# Patient Record
Sex: Female | Born: 1985 | Race: White | Hispanic: No | Smoking: Current some day smoker
Health system: Southern US, Community
[De-identification: ages and names within clinical notes are randomized; demographics above are authoritative.]

## PROBLEM LIST (undated history)

## (undated) DIAGNOSIS — I456 Pre-excitation syndrome: Secondary | ICD-10-CM

## (undated) DIAGNOSIS — A630 Anogenital (venereal) warts: Secondary | ICD-10-CM

## (undated) DIAGNOSIS — F909 Attention-deficit hyperactivity disorder, unspecified type: Secondary | ICD-10-CM

## (undated) DIAGNOSIS — R87629 Unspecified abnormal cytological findings in specimens from vagina: Secondary | ICD-10-CM

## (undated) HISTORY — PX: A FLUTTER ABLATION: SHX5348

## (undated) HISTORY — DX: Attention-deficit hyperactivity disorder, unspecified type: F90.9

## (undated) HISTORY — PX: WISDOM TOOTH EXTRACTION: SHX21

---

## 2016-01-02 ENCOUNTER — Emergency Department (HOSPITAL_COMMUNITY)
Admission: EM | Admit: 2016-01-02 | Discharge: 2016-01-03 | Disposition: A | Discharge status: Other Acute Inpatient Hospital | Payer: Self-pay | Attending: Emergency Medicine | Admitting: Emergency Medicine

## 2016-01-02 ENCOUNTER — Encounter (HOSPITAL_COMMUNITY): Payer: Self-pay | Admitting: Emergency Medicine

## 2016-01-02 DIAGNOSIS — Z87891 Personal history of nicotine dependence: Secondary | ICD-10-CM | POA: Insufficient documentation

## 2016-01-02 DIAGNOSIS — F1910 Other psychoactive substance abuse, uncomplicated: Secondary | ICD-10-CM | POA: Insufficient documentation

## 2016-01-02 DIAGNOSIS — F063 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, unspecified: Secondary | ICD-10-CM

## 2016-01-02 DIAGNOSIS — T50904A Poisoning by unspecified drugs, medicaments and biological substances, undetermined, initial encounter: Secondary | ICD-10-CM | POA: Insufficient documentation

## 2016-01-02 LAB — URINALYSIS, ROUTINE W REFLEX MICROSCOPIC
Bilirubin Urine: NEGATIVE
Glucose, UA: NEGATIVE mg/dL
HGB URINE DIPSTICK: NEGATIVE
KETONES UR: NEGATIVE mg/dL
Leukocytes, UA: NEGATIVE
Nitrite: NEGATIVE
PROTEIN: NEGATIVE mg/dL
Specific Gravity, Urine: 1.012 (ref 1.005–1.030)
pH: 8 (ref 5.0–8.0)

## 2016-01-02 LAB — COMPREHENSIVE METABOLIC PANEL
ALBUMIN: 5.2 g/dL — AB (ref 3.5–5.0)
ALT: 15 U/L (ref 14–54)
AST: 19 U/L (ref 15–41)
Alkaline Phosphatase: 46 U/L (ref 38–126)
Anion gap: 9 (ref 5–15)
BUN: 10 mg/dL (ref 6–20)
CHLORIDE: 104 mmol/L (ref 101–111)
CO2: 27 mmol/L (ref 22–32)
CREATININE: 0.9 mg/dL (ref 0.44–1.00)
Calcium: 9.7 mg/dL (ref 8.9–10.3)
GFR calc Af Amer: >60 mL/min (ref >60)
GLUCOSE: 90 mg/dL (ref 65–99)
POTASSIUM: 3.7 mmol/L (ref 3.5–5.1)
SODIUM: 140 mmol/L (ref 135–145)
Total Bilirubin: 1 mg/dL (ref 0.3–1.2)
Total Protein: 8.3 g/dL — ABNORMAL HIGH (ref 6.5–8.1)

## 2016-01-02 LAB — PREGNANCY, URINE: Preg Test, Ur: NEGATIVE

## 2016-01-02 LAB — CBC WITH DIFFERENTIAL/PLATELET
Basophils Absolute: 0 10*3/uL (ref 0.0–0.1)
Basophils Relative: 0 %
EOS PCT: 0 %
Eosinophils Absolute: 0 10*3/uL (ref 0.0–0.7)
HCT: 42.4 % (ref 36.0–46.0)
HEMOGLOBIN: 14.6 g/dL (ref 12.0–15.0)
LYMPHS ABS: 3.3 10*3/uL (ref 0.7–4.0)
LYMPHS PCT: 36 %
MCH: 31.3 pg (ref 26.0–34.0)
MCHC: 34.4 g/dL (ref 30.0–36.0)
MCV: 91 fL (ref 78.0–100.0)
MONOS PCT: 5 %
Monocytes Absolute: 0.5 10*3/uL (ref 0.1–1.0)
Neutro Abs: 5.3 10*3/uL (ref 1.7–7.7)
Neutrophils Relative %: 59 %
PLATELETS: 330 10*3/uL (ref 150–400)
RBC: 4.66 MIL/uL (ref 3.87–5.11)
RDW: 12.5 % (ref 11.5–15.5)
WBC: 9.1 10*3/uL (ref 4.0–10.5)

## 2016-01-02 LAB — ACETAMINOPHEN LEVEL

## 2016-01-02 LAB — SALICYLATE LEVEL

## 2016-01-02 LAB — RAPID URINE DRUG SCREEN, HOSP PERFORMED
AMPHETAMINES: POSITIVE — AB
Barbiturates: NOT DETECTED
Benzodiazepines: POSITIVE — AB
Cocaine: NOT DETECTED
Opiates: NOT DETECTED
TETRAHYDROCANNABINOL: NOT DETECTED

## 2016-01-02 LAB — ETHANOL: ALCOHOL ETHYL (B): 58 mg/dL — AB (ref <5)

## 2016-01-02 MED ORDER — SODIUM CHLORIDE 0.9 % IV SOLN: INTRAVENOUS | Status: DC

## 2016-01-02 MED ORDER — LORAZEPAM 2 MG/ML IJ SOLN
2.0000 mg | Freq: Once | INTRAMUSCULAR | Status: DC
  Filled 2016-01-02: qty 1

## 2016-01-02 MED ORDER — ZIPRASIDONE MESYLATE 20 MG IM SOLR
10.0000 mg | Freq: Once | INTRAMUSCULAR | Status: AC
  Administered 2016-01-02: 10 mg via INTRAMUSCULAR
  Filled 2016-01-02: qty 20

## 2016-01-02 MED ORDER — STERILE WATER FOR INJECTION IJ SOLN
INTRAMUSCULAR | Status: AC
  Administered 2016-01-02: 1.2 mL
  Filled 2016-01-02: qty 10

## 2016-01-02 MED ORDER — SODIUM CHLORIDE 0.9 % IV BOLUS (SEPSIS): 1000.0000 mL | Freq: Once | INTRAVENOUS | Status: DC

## 2016-01-02 NOTE — ED Notes (Signed)
Pt threatening to leave facility and to kill herself.  Pt biting at restraints and mitts once placed over hands.  Pt cursing at staff and officers and threatening to kill them.

## 2016-01-02 NOTE — ED Notes (Addendum)
[facility]-monitor patient and do medical clearance labs-will call back when VS and EKG obtained

## 2016-01-02 NOTE — ED Notes (Signed)
Pt is refusing all vital signs or to sit in bed.  Pt is tearful, agitated, and noncooperative but nonviolent at the moment.  Pt needs continual orientation to where she is and what happened.

## 2016-01-02 NOTE — ED Notes (Signed)
Pt pulled off cardiac monitoring equipment, BP cuff, and sat monitor and is standing in hall talking to MD and officer.  Pt is currently IVC.
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
## 2016-01-02 NOTE — ED Notes (Signed)
Bed: RESB Expected date:  Expected time:  Means of arrival:  Comments: EMS 30 yo female found unresponsive

## 2016-01-02 NOTE — ED Notes (Signed)
Pt sleeping at the moment with stable VS in waist and bilat ankle and wrist restraints.  Will hold ativan for now and monitor patient and restraints.
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
## 2016-01-03 ENCOUNTER — Encounter (HOSPITAL_COMMUNITY): Payer: Self-pay | Admitting: *Deleted

## 2016-01-03 ENCOUNTER — Inpatient Hospital Stay (HOSPITAL_COMMUNITY)
Admission: AD | Admit: 2016-01-03 | Discharge: 2016-01-08 | DRG: 885 | Disposition: A | Discharge status: Home / Self Care | Payer: Federal, State, Local not specified - Other | Attending: Psychiatry | Admitting: Psychiatry

## 2016-01-03 DIAGNOSIS — Z87891 Personal history of nicotine dependence: Secondary | ICD-10-CM | POA: Diagnosis not present

## 2016-01-03 DIAGNOSIS — F332 Major depressive disorder, recurrent severe without psychotic features: Secondary | ICD-10-CM | POA: Diagnosis present

## 2016-01-03 DIAGNOSIS — F17290 Nicotine dependence, other tobacco product, uncomplicated: Secondary | ICD-10-CM | POA: Diagnosis present

## 2016-01-03 DIAGNOSIS — R45851 Suicidal ideations: Secondary | ICD-10-CM | POA: Diagnosis present

## 2016-01-03 DIAGNOSIS — Z8659 Personal history of other mental and behavioral disorders: Secondary | ICD-10-CM | POA: Diagnosis not present

## 2016-01-03 DIAGNOSIS — Z915 Personal history of self-harm: Secondary | ICD-10-CM | POA: Diagnosis not present

## 2016-01-03 DIAGNOSIS — Z79899 Other long term (current) drug therapy: Secondary | ICD-10-CM | POA: Diagnosis not present

## 2016-01-03 DIAGNOSIS — F909 Attention-deficit hyperactivity disorder, unspecified type: Secondary | ICD-10-CM | POA: Diagnosis present

## 2016-01-03 DIAGNOSIS — F063 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, unspecified: Secondary | ICD-10-CM

## 2016-01-03 DIAGNOSIS — Y902 Blood alcohol level of 40-59 mg/100 ml: Secondary | ICD-10-CM | POA: Diagnosis present

## 2016-01-03 DIAGNOSIS — F1020 Alcohol dependence, uncomplicated: Secondary | ICD-10-CM | POA: Diagnosis present

## 2016-01-03 MED ORDER — HYDROXYZINE HCL 25 MG PO TABS
25.0000 mg | ORAL_TABLET | Freq: Four times a day (QID) | ORAL | Status: DC | PRN
  Administered 2016-01-03 – 2016-01-04 (×2): 25 mg via ORAL
  Filled 2016-01-03 (×2): qty 1

## 2016-01-03 MED ORDER — NICOTINE POLACRILEX 2 MG MT GUM
CHEWING_GUM | OROMUCOSAL | Status: AC
Start: 2016-01-03 — End: 2016-01-03
  Administered 2016-01-03: 21:00:00
  Filled 2016-01-03: qty 1

## 2016-01-03 MED ORDER — SODIUM CHLORIDE 0.9 % IV SOLN
Freq: Once | INTRAVENOUS | Status: AC
  Administered 2016-01-03: 05:00:00 via INTRAVENOUS

## 2016-01-03 MED ORDER — MAGNESIUM HYDROXIDE 400 MG/5ML PO SUSP: 30.0000 mL | Freq: Every day | ORAL | Status: DC | PRN

## 2016-01-03 MED ORDER — ACETAMINOPHEN 325 MG PO TABS
650.0000 mg | ORAL_TABLET | Freq: Four times a day (QID) | ORAL | Status: DC | PRN
  Administered 2016-01-04: 650 mg via ORAL
  Filled 2016-01-03: qty 2

## 2016-01-03 MED ORDER — NICOTINE 21 MG/24HR TD PT24
21.0000 mg | MEDICATED_PATCH | Freq: Every day | TRANSDERMAL | Status: DC
  Filled 2016-01-03 (×2): qty 1

## 2016-01-03 MED ORDER — HALOPERIDOL 5 MG PO TABS: 5.0000 mg | ORAL_TABLET | Freq: Four times a day (QID) | ORAL | Status: DC | PRN

## 2016-01-03 MED ORDER — BENZTROPINE MESYLATE 1 MG PO TABS: 1.0000 mg | ORAL_TABLET | Freq: Four times a day (QID) | ORAL | Status: DC | PRN

## 2016-01-03 MED ORDER — TRAZODONE HCL 50 MG PO TABS
50.0000 mg | ORAL_TABLET | Freq: Every evening | ORAL | Status: DC | PRN
  Administered 2016-01-03 – 2016-01-05 (×3): 50 mg via ORAL
  Filled 2016-01-03 (×3): qty 1

## 2016-01-03 MED ORDER — ALUM & MAG HYDROXIDE-SIMETH 200-200-20 MG/5ML PO SUSP: 30.0000 mL | ORAL | Status: DC | PRN

## 2016-01-03 MED ORDER — LORAZEPAM 1 MG PO TABS
2.0000 mg | ORAL_TABLET | Freq: Once | ORAL | Status: AC
  Administered 2016-01-03: 2 mg via ORAL
  Filled 2016-01-03: qty 2

## 2016-01-03 NOTE — ED Notes (Signed)
Poison control called back for update.  Pt's VS stable, pt is currently sleep but easily arousable with some disorientation to place.
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
## 2016-01-03 NOTE — ED Provider Notes (Signed)
[facility]   Pricilla LovelessScott Almina Schul, MD 01/03/16 [phone]

## 2016-01-03 NOTE — ED Notes (Signed)
[facility] Secretary stated RN would call back when available.

## 2016-01-03 NOTE — BH Assessment (Signed)
[facility] Assessment Progress Note  Per Akintayo MD IVC petition will be upheld and patient meets criteria for an inpatient admission.

## 2016-01-03 NOTE — BH Assessment (Signed)
[facility] Assessment Progress Note  Per Thedore MinsMojeed Akintayo, MD, this pt requires psychiatric hospitalization.  Amanda Heinrichina Tate, RN, [id number].  Pt presents under IVC initiated by law enforcement, and upheld by Dr Jannifer FranklinAkintayo, and [facility] documents [facility].  Pt's nurse has been notified, and agrees to call report to [phone].  Pt is to be transported via [employer]law enforcement.   Amanda Canninghomas Aleena Kirkeby, MA Triage Specialist [phone]
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
## 2016-01-03 NOTE — Progress Notes (Addendum)
Pt sleeping when Cm went to visit her Pt not awaken  ED CM left pt uninsured [place] resources in his locker #29 CM spoke with pt who [place]cp.  CM provided written information to assist pt with determining choice for uninsured accepting pcps, discussed the importance of pcp vs EDP services for f/u care, www.needymeds.org, www.goodrx.com, discounted pharmacies and other [place] resources such as [facility] , [facility], affordable care act, financial assistance, uninsured dental services, [place] med assist, DSS and  health department  Provided resources for [place] uninsured accepting pcps like [facility], family medicine at [address], [facility], [facility], local urgent care centers, [facility], [facility] family practice, general medical clinics, family services of the [place], [facility] urgent care plus others, medication resources, [facility] out patient pharmacies and housing Provided [facility] contact information   Entered in d/c instructions Please use the resources provided to you by the ED case manager to find a doctor for folllow up care        Next Steps: Schedule an appointment as soon as possible for a visit

## 2016-01-03 NOTE — ED Notes (Signed)
Poison control called back for update on pt.  Will call back around 0600 for another update.

## 2016-01-03 NOTE — Progress Notes (Signed)
Pt was belligerent and refusing treatment and demanding to leave   She refused her skin search and the rest of the admission she was uncooperative   Pt was put on a 1:1 for safety    Safety maintained at present

## 2016-01-04 DIAGNOSIS — F332 Major depressive disorder, recurrent severe without psychotic features: Secondary | ICD-10-CM

## 2016-01-04 DIAGNOSIS — F063 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, unspecified: Secondary | ICD-10-CM

## 2016-01-04 DIAGNOSIS — Z79899 Other long term (current) drug therapy: Secondary | ICD-10-CM

## 2016-01-04 MED ORDER — CHLORDIAZEPOXIDE HCL 25 MG PO CAPS: 25.0000 mg | ORAL_CAPSULE | ORAL | Status: DC

## 2016-01-04 MED ORDER — NICOTINE POLACRILEX 2 MG MT GUM
2.0000 mg | CHEWING_GUM | OROMUCOSAL | Status: DC | PRN
  Administered 2016-01-04 – 2016-01-08 (×14): 2 mg via ORAL
  Filled 2016-01-04 (×7): qty 1

## 2016-01-04 MED ORDER — CHLORDIAZEPOXIDE HCL 25 MG PO CAPS: 25.0000 mg | ORAL_CAPSULE | Freq: Every day | ORAL | Status: DC

## 2016-01-04 MED ORDER — CHLORDIAZEPOXIDE HCL 25 MG PO CAPS
25.0000 mg | ORAL_CAPSULE | Freq: Four times a day (QID) | ORAL | Status: AC | PRN
  Administered 2016-01-04: 25 mg via ORAL
  Filled 2016-01-04: qty 1

## 2016-01-04 MED ORDER — ONDANSETRON 4 MG PO TBDP: 4.0000 mg | ORAL_TABLET | Freq: Four times a day (QID) | ORAL | Status: AC | PRN

## 2016-01-04 MED ORDER — MIRTAZAPINE 7.5 MG PO TABS
7.5000 mg | ORAL_TABLET | Freq: Every day | ORAL | Status: DC
  Administered 2016-01-04 – 2016-01-07 (×4): 7.5 mg via ORAL
  Filled 2016-01-04 (×6): qty 1

## 2016-01-04 MED ORDER — ADULT MULTIVITAMIN W/MINERALS CH
1.0000 | ORAL_TABLET | Freq: Every day | ORAL | Status: DC
  Administered 2016-01-04 – 2016-01-08 (×5): 1 via ORAL
  Filled 2016-01-04 (×7): qty 1

## 2016-01-04 MED ORDER — LOPERAMIDE HCL 2 MG PO CAPS: 2.0000 mg | ORAL_CAPSULE | ORAL | Status: AC | PRN

## 2016-01-04 MED ORDER — VITAMIN B-1 100 MG PO TABS
100.0000 mg | ORAL_TABLET | Freq: Every day | ORAL | Status: DC
  Administered 2016-01-05 – 2016-01-08 (×4): 100 mg via ORAL
  Filled 2016-01-04 (×6): qty 1

## 2016-01-04 MED ORDER — THIAMINE HCL 100 MG/ML IJ SOLN
100.0000 mg | Freq: Once | INTRAMUSCULAR | Status: AC
Start: 2016-01-04 — End: 2016-01-04
  Administered 2016-01-04: 100 mg via INTRAMUSCULAR
  Filled 2016-01-04: qty 2

## 2016-01-04 MED ORDER — HYDROXYZINE HCL 25 MG PO TABS
25.0000 mg | ORAL_TABLET | Freq: Four times a day (QID) | ORAL | Status: AC | PRN
  Administered 2016-01-04 – 2016-01-06 (×3): 25 mg via ORAL
  Filled 2016-01-04 (×3): qty 1

## 2016-01-04 MED ORDER — CHLORDIAZEPOXIDE HCL 25 MG PO CAPS: 25.0000 mg | ORAL_CAPSULE | Freq: Four times a day (QID) | ORAL | Status: DC

## 2016-01-04 MED ORDER — CHLORDIAZEPOXIDE HCL 25 MG PO CAPS: 25.0000 mg | ORAL_CAPSULE | Freq: Three times a day (TID) | ORAL | Status: DC

## 2016-01-04 NOTE — [facility] Group Notes (Signed)
The focus of this group is to educate the patient on the purpose and policies of crisis stabilization and provide a format to answer questions about their admission.  The group details unit policies and expectations of patients while admitted.  Patient did not attend 0900 nurse education orientation group this morning.  Patient sleep with 1:1 present.

## 2016-01-04 NOTE — Progress Notes (Signed)
Discontinuation of 1:1 note: Patient denied SI and HI, contracts for safety.   Denied A/V hallucinations.  Patient took her 1700 medications.  Respirations even and unlabored.  No signs/symptoms of pain/distress noted on patient's face/bodyo movements.  Safety maintained with 15 minute checks.

## 2016-01-04 NOTE — Progress Notes (Signed)
Admission Note:  30 year old female who presents IVC, in no acute distress, for the treatment of SI. Patient was angry, agitated, defensive, and refused to participate or cooperate with admission process.  Patient was verbally aggressive and tearful on admission. Patient refused skin search and refused to sign any registration or admission paperwork.  Administrative Coordinator was called to assist with deescalating the patient and getting patient on the unit. Patient was placed on 1:1 observation on admission for safety.  Per IVC paper work, patient was found unresponsive in her car and reported depression and suicidal thoughts.  Per report, patient had to be restrained on admission to the previous hospital due to her behavior.  Food and fluids offered and refused.

## 2016-01-04 NOTE — Progress Notes (Signed)
1:1 Note: Patient has been resting in bed after lunch.  1:1 continues for safety.  Patient denied SI and HI.  Denied A/V hallucinations.  Respirations even and unlabored.  No signs/symptoms of pain/distress noted on patient's face/body movements.  1:1 continues per MD order.

## 2016-01-04 NOTE — Progress Notes (Signed)
1:1 Note: Patient has been sleeping this morning with 1:1 present.  Laying on R side with eyes closed.  Respirations even and unlabored.  No signs/symptoms of pain/distress noted on patient's face/body movements.  Safety maintained with 1:1 present.

## 2016-01-04 NOTE — Progress Notes (Signed)
Final Discontinuation of 1:1 note:  Pt resting in bed with eyes closed, even and unlabored respirations.  No s/s of distress or discomfort noted.  Pt's safety maintained on the unit, will continue to monitor with 15 minute checks per protocol.

## 2016-01-04 NOTE — Tx Team (Signed)
Initial Treatment Plan 01/04/2016 12:31 AM Amanda Wagner [id number]    PATIENT STRESSORS: Occupational concerns Substance abuse   PATIENT STRENGTHS: Average or above average intelligence Capable of independent living Financial means Physical Health   PATIENT IDENTIFIED PROBLEMS: At risk for suicide  Depression  "I just want to get out of here"  "I have to leave and take care of my daughter"               DISCHARGE CRITERIA:  Ability to meet basic life and health needs Improved stabilization in mood, thinking, and/or behavior Motivation to continue treatment in a less acute level of care Need for constant or close observation no longer present Safe-care adequate arrangements made Withdrawal symptoms are absent or subacute and managed without 24-hour nursing intervention  PRELIMINARY DISCHARGE PLAN: Attend 12-step recovery group Outpatient therapy Return to previous living arrangement Return to previous work or school arrangements  PATIENT/FAMILY INVOLVEMENT: This treatment plan has been presented to and reviewed with the patient, Amanda Wagner.  The patient and family have been given the opportunity to ask questions and make suggestions.  Carleene OverlieMiddleton, Bobbie Valletta P, RN 01/04/2016, 12:31 AM

## 2016-01-04 NOTE — Progress Notes (Signed)
[facility] Group Notes:  (Nursing/MHT/Case Management/Adjunct)  Date:  01/04/2016  Time:  9:25 PM  Type of Therapy:  Psychoeducational Skills  Participation Level:  Active  Participation Quality:  Appropriate  Affect:  Blunted  Cognitive:  Appropriate  Insight:  Appropriate  Engagement in Group:  Engaged  Modes of Intervention:  Education  Summary of Progress/Problems: Patient states that she had a good day overall. She explained that she is feeling better and is also feeling "stronger". As for the theme of the day, her support system will consist of her mother and her daughter's father.   Hazle CocaGOODMAN, Lucette Kratz S 01/04/2016, 9:25 PM

## 2016-01-04 NOTE — B[facility] Suicide Risk Assessment (Signed)
[facility]   Nursing information obtained from:  Patient Demographic factors:  Caucasian Current Mental Status:   (Patient refused to answer questions on admission) Loss Factors:   (Patient refused to answer questions on admission) Historical Factors:   (Patient refused to answer questions on admission) Risk Reduction Factors:  Responsible for children under 30 years of age  Total Time spent with patient: 1 hour Principal Problem: <principal problem not specified> Diagnosis:   Patient Active Problem List   Diagnosis Date Noted  . 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   Continued Clinical Symptoms:    The "Alcohol Use Disorders Identification Test", Guidelines for Use in Primary Care, Second Edition.  World Science writerHealth Organization [facility](WHO). Score between 0-7:  no or low risk or alcohol related problems. Score between 8-15:  moderate risk of alcohol related problems. Score between 16-19:  high risk of alcohol related problems. Score 20 or above:  warrants further diagnostic evaluation for alcohol dependence and treatment.   CLINICAL FACTORS:   Alcohol/Substance Abuse/Dependencies Personality Disorders:   Cluster B Depression.  Musculoskeletal: Strength & Muscle Tone: within normal limits Gait & Station: normal Patient leans: no lean  Psychiatric Specialty Exam: Physical Exam  Constitutional: She appears well-developed.  HENT:  Head: Normocephalic.    Review of Systems  Cardiovascular: Negative for chest pain.  Skin: Negative for rash.  Psychiatric/Behavioral: Positive for depression. The patient is nervous/anxious.     Blood pressure 116/68, pulse 82, temperature 99.5 F (37.5 C), resp. rate 16, SpO2 98 %.There is no height or weight on file to calculate BMI.  General Appearance: Casual  Eye Contact:  Fair  Speech:  Normal Rate  Volume:   Decreased  Mood:  Dysphoric  Affect:  Depressed  Thought Process:  Goal Directed  Orientation:  Full (Time, Place, and Person)  Thought Content:  Rumination  Suicidal Thoughts:  No  Homicidal Thoughts:  No  Memory:  Immediate;   Fair Recent;   Fair  Judgement:  Poor  Insight:  Shallow  Psychomotor Activity:  Normal  Concentration:  Concentration: Fair and Attention Span: Fair  Recall:  [employer]Fair  Fund of Knowledge:  Fair  Language:  Fair  Akathisia:  Negative  Handed:  Right  AIMS (if indicated):     Assets:  Others:  minimal  ADL's:  Intact  Cognition:  WNL  Sleep:  Number of Hours: 6.5      COGNITIVE FEATURES THAT CONTRIBUTE TO RISK:  Closed-mindedness    SUICIDE RISK:   Severe:  Frequent, intense, and enduring suicidal ideation, specific plan, no subjective intent, but some objective markers of intent (i.e., choice of lethal method), the method is accessible, some limited preparatory behavior, evidence of impaired self-control, severe dysphoria/symptomatology, multiple risk factors present, and few if any protective factors, particularly a lack of social support.   PLAN OF CARE: Admit for stabilization and medication management. Safety   I certify that inpatient services furnished can reasonably be expected to improve the patient's condition.  Thresa RossAKHTAR, Earnest Thalman, MD 01/04/2016, 2:51 PM

## 2016-01-04 NOTE — Progress Notes (Signed)
Pt has been sleeping with no distress noted   She is on a 1:1 for safety because she refused the skin and contraband search    She is presently safe

## 2016-01-04 NOTE — Progress Notes (Signed)
1:1 Note Patient's 1:1 has been discontinued per MD order.  Patient denied SI and HI, contracts for safety.  Denied A/V hallucinations.  Respirations even and unlabored.  No signs/symptoms of pain/distress noted on patient's face/body movements.  Patient has been sleeping after lunch.  Safety to be maintained with 15 minute checks.

## 2016-01-04 NOTE — Plan of Care (Signed)
Problem: Education: Goal: Utilization of techniques to improve thought processes will improve Outcome: Not Progressing Nurse discussed depression/coping skills with patient.    

## 2016-01-04 NOTE — Progress Notes (Signed)
1:1 Note: Patient ate approximately 35% of lunch.  Patient has been asking for her home medications, adderall, etc.  Patient has talked to NP about her medications.  Respirations even and unlabored.  No signs/symptoms of pain/distress noted on patient's face/body movements.  1:1 continues for patient's safety.

## 2016-01-04 NOTE — Progress Notes (Signed)
1:1 Note:   1055  Patient given nicotine gum.  Patient talked to MD and 1:1 to be continued per MD order.  Patient talked to MD about her home medications which will not be continued and patient is upset, mad at MD, irritable, tearful.  Safety maintained with 1:1 per MD order.

## 2016-01-04 NOTE — Progress Notes (Signed)
Discontinuation of 1:1 note:  Pt up and attended group, no complaints voiced.  No signs or symptoms of distress or pain noted.  Safety maintained with 15 minute checks per protocol.

## 2016-01-04 NOTE — BHH Group Notes (Signed)
Adult Therapy Group Note  Date:  01/04/2016  Time:   11:15am-12:00pm  Group Topic/Focus: Unhealthy vs Healthy Coping Techniques  Building Self Esteem:   The focus of this group was to assist patients in becoming aware of the differences between healthy and unhealthy coping techniques, as well as how to determine which type they are using.  The benefits of learning healthier coping were discussed.   An exercise was used to identify patients' fears and to demonstrate that in general, their fears are commonly experienced.  This helped patients to provide support to each other and to determine that, in fact, they are not alone.   The whiteboard was utilized to record the discussion, and many patients copied this down.  Participation Level:  Minimal  Participation Quality:  Attentive  Affect:  Flat  Cognitive:  Alert  Insight: Improving  Engagement in Group:  Improving  Modes of Intervention:  Exercise, Discussion and Support  Additional Comments:  The patient expressed herself only a few times during group, when called on directly.  Ambrose MantleMareida Grossman-Orr, LCSW 01/04/2016   12:35pm

## 2016-01-04 NOTE — Progress Notes (Signed)
1:1 Note: Patient has been seen by NP this morning.  Patient walked down hallway, sat in dayroom a few minutes.  Skin check completed on patient this morning.  No problems with skin.  Respirations even and unlabored.  No signs/symptoms of pain/distress noted on patient's face/body movements.  Safety maintained with 1:1 present per MD order.

## 2016-01-04 NOTE — Progress Notes (Signed)
D.  Pt presented with flat affect, complaint of headache.  Pt did attend evening wrap up group with appropriate interaction (see group note).  Pt observed interacting appropriately with peers on the unit.  Pt denies SI/HI/hallucinations at this time.  A.  Support and encouragement offered, medication given as ordered  R.  Pt remains safe on the unit, will continue to monitor.

## 2016-01-04 NOTE — Progress Notes (Signed)
Pt resting in bed   Eyes closed   Normal respirations  No distress noted    Pt on 1:1 and is presently safe

## 2016-01-04 NOTE — H&P (Signed)
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 Severe episode of recurrent major depressive disorder, without psychotic features ([place]) [F33.2]   . 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 Patient was angry, agitated, defensive, and refused to participate or cooperate with admission process.  Patient was verbally aggressive and tearful on admission. Patient refused skin search and refused to sign any registration or admission paperwork.  Administrative Coordinator was called to assist with deescalating the patient and getting patient on the unit. Patient was placed on 1:1 observation on admission for safety.  Per IVC paper work, patient was found unresponsive in her car and reported depression and suicidal thoughts.  Per report, patient had to be restrained on admission to the previous hospital due to her behavior.  Food and fluids offered and refused.  ON Evaluation: Imberly Troxler is awake, alert and oriented * 3. Patient is irritable and tearful.  Patient was palaced on 1:1 (due to refusal of skin search)  Denies suicidal or homicidal ideation. Denies auditory or visual hallucination and does not appear to be responding to internal stimuli. Patient reports she is prdx adderall 30 mg and xanax 0.71m. (not listed in the [place] controlled substance)  Patient was requesting to be discharge. States she is not interested in getting help. States she doesn't want help with her drug addiction. Support, encouragement and reassurance was provided.   Associated Signs/Symptoms: Depression  Symptoms:  depressed mood, difficulty concentrating, anxiety, (Hypo) Manic Symptoms:  Impulsivity, Irritable Mood, Anxiety Symptoms:  Excessive Worry, Psychotic Symptoms:  Hallucinations: None PTSD Symptoms: Avoidance:  Decreased Interest/Participation Total Time spent with patient: 30 minutes  Past Psychiatric History:   Is the patient at risk to self? Yes.    Has the patient been a risk to self in the past 6 months? Yes.    Has the patient been a risk to self within the distant past? Yes.    Is the patient a risk to others? No.  Has the patient been a risk to others in the past 6 months? No.  Has the patient been a risk to others within the distant past? No.   Prior Inpatient Therapy:   Prior Outpatient Therapy:    Alcohol Screening: Patient refused Alcohol Screening Tool: Yes Substance Abuse History in the last 12 months:  Yes.   Consequences of Substance Abuse: Withdrawal Symptoms:   Headaches Previous Psychotropic Medications: no Psychological Evaluation:no Past Medical History: History reviewed. No pertinent past medical history.  Past Surgical History:  Procedure Laterality Date  . WISDOM TOOTH EXTRACTION     Family History: History reviewed. No pertinent family history. Family Psychiatric  History:  Tobacco Screening: Have you used any form of tobacco in the last 30 days? (Cigarettes, Smokeless Tobacco, Cigars, and/or Pipes): Patient Refused Screening Social History:  History  Alcohol Use  . Yes    Comment: reports 'excessively', does not specify     History  Drug use: Unknown    Additional Social History:  Allergies:  No Known Allergies Lab Results:  Results for orders placed or performed during the hospital encounter of 01/02/16 (from the past 48 hour(s))  Acetaminophen level     Status: Abnormal   Collection Time: 01/02/16  9:11 PM  Result Value Ref Range   Acetaminophen (Tylenol), Serum <10 (L) 10 - 30 ug/mL     Comment:        THERAPEUTIC CONCENTRATIONS VARY SIGNIFICANTLY. A RANGE OF 10-30 ug/mL MAY BE AN EFFECTIVE CONCENTRATION FOR MANY PATIENTS. HOWEVER, SOME ARE BEST TREATED AT CONCENTRATIONS OUTSIDE THIS RANGE. ACETAMINOPHEN CONCENTRATIONS >150 ug/mL AT 4 HOURS AFTER INGESTION AND >50 ug/mL AT 12 HOURS AFTER INGESTION ARE OFTEN ASSOCIATED WITH TOXIC REACTIONS.   Comprehensive metabolic panel     Status: Abnormal   Collection Time: 01/02/16  9:11 PM  Result Value Ref Range   Sodium 140 135 - 145 mmol/L   Potassium 3.7 3.5 - 5.1 mmol/L   Chloride 104 101 - 111 mmol/L   CO2 27 22 - 32 mmol/L   Glucose, Bld 90 65 - 99 mg/dL   BUN 10 6 - 20 mg/dL   Creatinine, Ser 0.90 0.44 - 1.00 mg/dL   Calcium 9.7 8.9 - 10.3 mg/dL   Total Protein 8.3 (H) 6.5 - 8.1 g/dL   Albumin 5.2 (H) 3.5 - 5.0 g/dL   AST 19 15 - 41 U/L   ALT 15 14 - 54 U/L   Alkaline Phosphatase 46 38 - 126 U/L   Total Bilirubin 1.0 0.3 - 1.2 mg/dL   GFR calc non Af Amer >60 >60 mL/min   GFR calc Af Amer >60 >60 mL/min    Comment: (NOTE) The eGFR has been calculated using the CKD EPI equation. This calculation has not been validated in all clinical situations. eGFR's persistently <60 mL/min signify possible Chronic Kidney Disease.    Anion gap 9 5 - 15  CBC WITH DIFFERENTIAL     Status: None   Collection Time: 01/02/16  9:11 PM  Result Value Ref Range   WBC 9.1 4.0 - 10.5 K/uL   RBC 4.66 3.87 - 5.11 MIL/uL   Hemoglobin 14.6 12.0 - 15.0 g/dL   HCT 42.4 36.0 - 46.0 %   MCV 91.0 78.0 - 100.0 fL   MCH 31.3 26.0 - 34.0 pg   MCHC 34.4 30.0 - 36.0 g/dL   RDW 12.5 11.5 - 15.5 %   Platelets 330 150 - 400 K/uL   Neutrophils Relative % 59 %   Neutro Abs 5.3 1.7 - 7.7 K/uL   Lymphocytes Relative 36 %   Lymphs Abs 3.3 0.7 - 4.0 K/uL   Monocytes Relative 5 %   Monocytes Absolute 0.5 0.1 - 1.0 K/uL   Eosinophils Relative 0 %   Eosinophils Absolute 0.0 0.0 - 0.7 K/uL   Basophils Relative 0 %   Basophils Absolute 0.0 0.0 -  0.1 K/uL  Urine rapid drug screen (hosp performed)not at [facility]     Status: Abnormal   Collection Time: 01/02/16  9:11 PM  Result Value Ref Range   Opiates NONE DETECTED NONE DETECTED   Cocaine NONE DETECTED NONE DETECTED   Benzodiazepines POSITIVE (A) NONE DETECTED   Amphetamines POSITIVE (A) NONE DETECTED   Tetrahydrocannabinol NONE DETECTED NONE DETECTED   Barbiturates NONE DETECTED NONE DETECTED    Comment:        DRUG SCREEN FOR MEDICAL PURPOSES ONLY.  IF CONFIRMATION IS NEEDED FOR ANY PURPOSE, NOTIFY LAB WITHIN 5 DAYS.  LOWEST DETECTABLE LIMITS FOR URINE DRUG SCREEN Drug Class       Cutoff (ng/mL) Amphetamine      1000 Barbiturate      200 Benzodiazepine   427 Tricyclics       062 Opiates          300 Cocaine          300 THC              50   Ethanol     Status: Abnormal   Collection Time: 01/02/16  9:11 PM  Result Value Ref Range   Alcohol, Ethyl (B) 58 (H) <5 mg/dL    Comment:        LOWEST DETECTABLE LIMIT FOR SERUM ALCOHOL IS 5 mg/dL FOR MEDICAL PURPOSES ONLY   Salicylate level     Status: None   Collection Time: 01/02/16  9:11 PM  Result Value Ref Range   Salicylate Lvl <3.7 2.8 - 30.0 mg/dL  Urinalysis, Routine w reflex microscopic     Status: Abnormal   Collection Time: 01/02/16  9:11 PM  Result Value Ref Range   Color, Urine YELLOW YELLOW   APPearance CLOUDY (A) CLEAR   Specific Gravity, Urine 1.012 1.005 - 1.030   pH 8.0 5.0 - 8.0   Glucose, UA NEGATIVE NEGATIVE mg/dL   Hgb urine dipstick NEGATIVE NEGATIVE   Bilirubin Urine NEGATIVE NEGATIVE   Ketones, ur NEGATIVE NEGATIVE mg/dL   Protein, ur NEGATIVE NEGATIVE mg/dL   Nitrite NEGATIVE NEGATIVE   Leukocytes, UA NEGATIVE NEGATIVE  Pregnancy, urine     Status: None   Collection Time: 01/02/16  9:11 PM  Result Value Ref Range   Preg Test, Ur NEGATIVE NEGATIVE    Comment:        THE SENSITIVITY OF THIS METHODOLOGY IS >20 mIU/mL.     Blood Alcohol level:  Lab Results  Component Value  Date   ETH 58 (H) 62/83/1517    Metabolic Disorder Labs:  No results found for: HGBA1C, MPG No results found for: PROLACTIN No results found for: CHOL, TRIG, HDL, CHOLHDL, VLDL, LDLCALC  Current Medications: Current Facility-Administered Medications  Medication Dose Route Frequency Provider Last Rate Last Dose  . acetaminophen (TYLENOL) tablet 650 mg  650 mg Oral Q6H PRN Ethelene Hal, NP      . alum & mag hydroxide-simeth (MAALOX/MYLANTA) 200-200-20 MG/5ML suspension 30 mL  30 mL Oral Q4H PRN Ethelene Hal, NP      . hydrOXYzine (ATARAX/VISTARIL) tablet 25 mg  25 mg Oral Q6H PRN Ethelene Hal, NP   25 mg at 01/04/16 0950  . magnesium hydroxide (MILK OF MAGNESIA) suspension 30 mL  30 mL Oral Daily PRN Ethelene Hal, NP      . nicotine polacrilex (NICORETTE) gum 2 mg  2 mg Oral PRN Jenne Campus, MD   2 mg at 01/04/16 1214  . traZODone (DESYREL) tablet 50 mg  50 mg Oral QHS PRN Ethelene Hal, NP   50 mg at 01/03/16 2044   PTA Medications: Prescriptions Prior to Admission  Medication Sig Dispense Refill Last Dose  . ALPRAZolam (XANAX) 0.5 MG tablet Take 0.5 mg by mouth 2 (two) times daily.   01/03/2016 at Unknown time  . amphetamine-dextroamphetamine (ADDERALL) 10 MG tablet Take 30 mg by mouth daily with breakfast.   01/03/2016 at Unknown time    Musculoskeletal: Strength & Muscle Tone: within normal limits Gait & Station: normal Patient leans: N/A  Psychiatric Specialty  Exam: Physical Exam  Nursing note and vitals reviewed. Constitutional: She is oriented to person, place, and time. She appears well-developed.  Cardiovascular: Normal rate.   Neurological: She is alert and oriented to person, place, and time.  Psychiatric: She has a normal mood and affect. Her behavior is normal.    Review of Systems  Psychiatric/Behavioral: Positive for depression and substance abuse. The patient is nervous/anxious.     Blood pressure 116/68, pulse 82,  temperature 99.5 F (37.5 C), resp. rate 16, SpO2 98 %.There is no height or weight on file to calculate BMI.  General Appearance: Disheveled  Eye Contact:  Fair  Speech:  Clear and Coherent  Volume:  Normal  Mood:  Anxious and Irritable  Affect:  Blunt, Depressed and Flat  Thought Process:  Coherent  Orientation:  Full (Time, Place, and Person)  Thought Content:  Negative and Hallucinations: None  Suicidal Thoughts:  No  Homicidal Thoughts:  No  Memory:  Immediate;   Poor Recent;   Fair Remote;   Fair  Judgement:  Fair  Insight:  Lacking  Psychomotor Activity:  Restlessness  Concentration:  Concentration: Poor  Recall:  Poor  Fund of Knowledge:  Fair  Language:  Poor  Akathisia:  No  Handed:  Right  AIMS (if indicated):     Assets:  Communication Skills Resilience Social Support  ADL's:  Intact  Cognition:  WNL  Sleep:  Number of Hours: 6.5     I agree with current treatment plan on 01/04/2016, Patient seen face-to-face for psychiatric evaluation follow-up, chart reviewed and case discussed with the MD Akahtar.. Reviewed the information documented and agree with the treatment plan.   Treatment Plan Summary: Daily contact with patient to assess and evaluate symptoms and progress in treatment and Medication management  Check [place] Controlled Substance- Nothing was found in system  Started Librium Protocol (CIWA's) Start  Remeron 7.5 mg for insomnia/depression Will continue to monitor vitals ,medication compliance and treatment side effects while patient is here.  Reviewed labs:BAL - 58, UDS - pos for amphetamines and benzodiazepines  CSW will start working on disposition.  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, unspecified Long Term Goal(s): Improvement in symptoms so as ready for discharge  Short Term Goals: Ability to identify changes in lifestyle to reduce recurrence of condition will improve, Ability to demonstrate self-control will improve and Ability to maintain clinical measurements within normal limits will improve  Physician Treatment Plan for Secondary Diagnosis: Principal Problem:   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, unspecified Active Problems:   Severe episode of recurrent major depressive disorder, without psychotic features ([place])  Long Term Goal(s): Improvement in symptoms so as ready for discharge  Short Term Goals: Ability to identify changes in lifestyle to reduce recurrence of condition will improve, Ability to disclose and discuss suicidal ideas, Compliance with prescribed medications will improve and Ability to identify triggers associated with substance abuse/mental health issues will improve  I certify that inpatient services furnished can reasonably be expected to improve the patient's condition.    Derrill Center, NP 12/9/20174:13 PM  I have examined the patient and agreed with the findings of H&P and treatment plan. I have also done suicide assessment on this patient.

## 2016-01-05 DIAGNOSIS — Z87891 Personal history of nicotine dependence: Secondary | ICD-10-CM

## 2016-01-05 NOTE — [facility] Group Notes (Signed)
The focus of this group is to educate the patient on the purpose and policies of crisis stabilization and provide a format to answer questions about their admission.  The group details unit policies and expectations of patients while admitted.  Patient did not attend 0900 nurse education orientation group this morning.  Patient stayed in room.  

## 2016-01-05 NOTE — Progress Notes (Signed)
D:  Patient's self inventory sheet, patient has fair sleep, sleep medication is helpful.  Good appetite, low energy level, poor concentration.  Rated depression, hopeless and anxiety #2.  Denied withdrawals.  Does feel irritable.  Denied SI.  Denied physical problems.  Denied pain.  Goal is to feel better about herself.  Plans to write down things that are good about herself.  Does have discharge plans. A:  Medications administered per MD orders.  Emotional support and encouragement given patient. R:  Denied SI and HI, contracts for safety.  Denied A/V hallucinations.  Safety maintained with 15 minute checks.

## 2016-01-05 NOTE — Plan of Care (Signed)
Problem: Medication: Goal: Compliance with prescribed medication regimen will improve Outcome: Progressing Patient was compliant with scheduled medication.    

## 2016-01-05 NOTE — Progress Notes (Signed)
[facility] MD Progress Note  01/05/2016 10:26 AM Amanda Wagner  MRN:  [id number] Subjective:  Patient reports " I am fine, I am just ready to leave here. I don't want treatment."  Objective:Amanda Wagner is awake, alert and oriented *3. Seen resting in bedroom. Denies suicidal or homicidal ideation. Denies auditory or visual hallucination and does not appear to be responding to internal stimuli.  Patient reports she is medication compliant without mediation side effects. Per staffing notes patient has been isolative and guarded while on the unit. Patient denies depression or depressive symptoms. Reports fair appetite and resting well.  Support, encouragement and reassurance was provided.   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 Severe episode of recurrent major depressive disorder, without psychotic features (HCC) [F33.2]   . 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 No pertinent past medical history.  Past Surgical History:  Procedure Laterality Date  . WISDOM TOOTH EXTRACTION     Family History: History reviewed. No pertinent family history. Family Psychiatric  History: Social History:  History  Alcohol Use  . Yes    Comment: reports 'excessively', does not specify     History  Drug use: Unknown    Social History   Social History  . Marital status: Unknown    Spouse name: N/A  . Number of children: N/A  . Years of education: N/A   Social History Main Topics  . Smoking status: Former Games developer  . Smokeless tobacco: Never Used     Comment: reports 'I'm using nicorette to quit'  . Alcohol use Yes     Comment: reports 'excessively', does not specify  . Drug use: Unknown  . Sexual activity: Not Asked   Other Topics  Concern  . None   Social History Narrative  . None   Additional Social History:                         Sleep: Fair  Appetite:  Fair  Current Medications: Current Facility-Administered Medications  Medication Dose Route Frequency Provider Last Rate Last Dose  . acetaminophen (TYLENOL) tablet 650 mg  650 mg Oral Q6H PRN [name], NP   650 mg at 01/04/16 2059  . alum & mag hydroxide-simeth (MAALOX/MYLANTA) 200-200-20 MG/5ML suspension 30 mL  30 mL Oral Q4H PRN [name], NP      . chlordiazePOXIDE (LIBRIUM) capsule 25 mg  25 mg Oral Q6H PRN [name], NP   25 mg at 01/04/16 2059  . hydrOXYzine (ATARAX/VISTARIL) tablet 25 mg  25 mg Oral Q6H PRN [name], NP   25 mg at 01/04/16 2059  . loperamide (IMODIUM) capsule 2-4 mg  2-4 mg Oral PRN [name], NP      . magnesium hydroxide (MILK OF MAGNESIA) suspension 30 mL  30 mL Oral Daily PRN [name], NP      . mirtazapine (REMERON) tablet 7.5 mg  7.5 mg Oral QHS [name], NP   7.5 mg at 01/04/16 2059  . multivitamin with minerals tablet 1 tablet  1 tablet Oral Daily [name], NP   1 tablet at 01/05/16 0845  . nicotine polacrilex (NICORETTE) gum 2 mg  2 mg Oral PRN Madaline Guthrie  A Cobos, MD   2 mg at 01/05/16 1004  . ondansetron (ZOFRAN-ODT) disintegrating tablet 4 mg  4 mg Oral Q6H PRN Oneta Rackanika N Lewis, NP      . thiamine (VITAMIN B-1) tablet 100 mg  100 mg Oral Daily Oneta Rackanika N Lewis, NP   100 mg at 01/05/16 0845  . traZODone (DESYREL) tablet 50 mg  50 mg Oral QHS PRN [name], NP   50 mg at 01/04/16 2058    Lab Results: No results found for this or any previous visit (from the past 48 hour(s)).  Blood Alcohol level:  Lab Results  Component Value Date   ETH 58 (H) 01/02/2016    Metabolic Disorder Labs: No results found for: HGBA1C, MPG No results found for: PROLACTIN No results found for: CHOL, TRIG, HDL, CHOLHDL, VLDL, LDLCALC  Physical Findings: AIMS:  Facial and Oral Movements Muscles of Facial Expression: None, normal Lips and Perioral Area: None, normal Jaw: None, normal Tongue: None, normal,Extremity Movements Upper (arms, wrists, hands, fingers): None, normal Lower (legs, knees, ankles, toes): None, normal, Trunk Movements Neck, shoulders, hips: None, normal, Overall Severity Severity of abnormal movements (highest score from questions above): None, normal Incapacitation due to abnormal movements: None, normal Patient's awareness of abnormal movements (rate only patient's report): No Awareness, Dental Status Current problems with teeth and/or dentures?: No Does patient usually wear dentures?: No  CIWA:  CIWA-Ar Total: 0 COWS:     Musculoskeletal: Strength & Muscle Tone: within normal limits Gait & Station: normal Patient leans: N/A  Psychiatric Specialty Exam: Physical Exam  Nursing note and vitals reviewed. Constitutional: She is oriented to person, place, and time. She appears well-developed.  Neurological: She is alert and oriented to person, place, and time.  Psychiatric: She has a normal mood and affect. Her behavior is normal.    Review of Systems  Psychiatric/Behavioral: Positive for depression and substance abuse. The patient is nervous/anxious.     Blood pressure 109/69, pulse 65, temperature 97.7 F (36.5 C), temperature source Oral, resp. rate 16, SpO2 98 %.There is no height or weight on file to calculate BMI.  General Appearance: Casual  Eye Contact:  Fair  Speech:  Clear and Coherent  Volume:  Normal  Mood:  Angry, Depressed and Irritable  Affect:  Depressed and Flat  Thought Process:  Linear  Orientation:  Full (Time, Place, and Person)  Thought Content:  Hallucinations: None  Suicidal Thoughts:  No  Homicidal Thoughts:  No  Memory:  Immediate;   Fair Recent;   Fair Remote;   Fair  Judgement:  Fair  Insight:  Fair  Psychomotor Activity:  Restlessness  Concentration:  Concentration: Fair   Recall:  FiservFair  Fund of Knowledge:  Fair  Language:  Fair  Akathisia:  No  Handed:  Right  AIMS (if indicated):     Assets:  Communication Skills Resilience Social Support  ADL's:  Intact  Cognition:  WNL  Sleep:  Number of Hours: 6.75     I agree with current treatment plan on 01/05/2016, Patient seen face-to-face for psychiatric evaluation follow-up, chart reviewed. Reviewed the information documented and agree with the treatment plan.  Treatment Plan Summary: Daily contact with patient to assess and evaluate symptoms and progress in treatment and Medication management   Check [place] Controlled Substance- Nothing was found in system  Started Librium Protocol (CIWA's) Start  Remeron 7.5 mg for insomnia/depression Will continue to monitor vitals ,medication compliance and treatment side effects while patient is here.  Reviewed labs:BAL - 58, UDS - pos for amphetamines and benzodiazepines  CSW will start working on disposition.  Patient to participate in therapeutic milieu Oneta Rackanika N Lewis, NP 01/05/2016, 10:26 AM  Agree to the notes and plan.

## 2016-01-05 NOTE — [facility] Group Notes (Signed)
[facility] Group Notes:  (Clinical Social Work)  01/05/2016  11:00AM-12:00PM  Summary of Progress/Problems:  The main focus of today's process group was to listen to a variety of genres of music and to identify that different types of music provoke different responses.  The patient then was able to identify personally what was soothing for them, as well as energizing, as well as how patient can personally use this knowledge in sleep habits, with depression, and with other symptoms.  The patient expressed at the beginning of group the overall feeling of "upset" and was observed at the nursing station crying, presented to group with red eyes.  About halfway through the group, she started to smile and sing along, and at the end of group stated she felt "better, content, grateful."  Type of Therapy:  Music Therapy   Participation Level:  Active  Participation Quality:  Attentive   Affect:  Blunted and Tearful, then Appropriate  Cognitive:  Oriented  Insight:  Engaged  Engagement in Therapy:  Engaged  Modes of Intervention:   Activity, Exploration  Ambrose MantleMareida Grossman-Orr, LCSW 01/05/2016

## 2016-01-05 NOTE — B[facility] Counselor (Signed)
Adult Comprehensive Assessment  Patient ID: Amanda Wagner, female   DOB: 05-17-1985, 30 y.o.   MRN: [id number]  Information Source: Information source: Patient  Current Stressors:  Family Relationships: doesn't feel supported by family Social relationships: boyfriend recently broke up with pt  Living/Environment/Situation:  Living Arrangements: Non-relatives/Friends Living conditions (as described by patient or guardian): Pt states that she has a 3 bedroom house that she lived in alone but let a friend and her family move in 1 week ago.  Environment is now stressful with them being there.   How long has patient lived in current situation?: 2 years What is atmosphere in current home: Comfortable  Family History:  Marital status: Single Does patient have children?: Yes How many children?: 1 How is patient's relationship with their children?: 30 year old daughter - pt states that she gets her every other week, pt reports having a good relationship with daughter.    Childhood History:  By whom was/is the patient raised?: Both parents Additional childhood history information: Pt reports her childhood was interesting due to her father having clinical depression and her mother being a control freak.  Description of patient's relationship with caregiver when they were a child: Pt reports strained relationship with parents growing up.  Patient's description of current relationship with people who raised him/her: Pt reports distant relationship with parents today.  How were you disciplined when you got in trouble as a child/adolescent?: physical discipline Does patient have siblings?: Yes Number of Siblings: 1 Description of patient's current relationship with siblings: pt reports distant but decent relationship with sister Did patient suffer any verbal/emotional/physical/sexual abuse as a child?: No Did patient suffer from severe childhood neglect?: No Has patient ever been sexually  abused/assaulted/raped as an adolescent or adult?: No Was the patient ever a victim of a crime or a disaster?: No Witnessed domestic violence?: No Has patient been effected by domestic violence as an adult?: No  Education:  Highest grade of school patient has completed: Energy managerBachelor's in Music therapistcience and mechanical engineering Currently a student?: No Learning disability?: No  Employment/Work Situation:   Employment situation: Employed 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[employer] How long has patient been employed?: 2 years Patient's job has been impacted by current illness: No What is the longest time patient has a held a job?: 3 years Where was the patient employed at that time?: [employer] Has patient ever been in the Eli [employer] and Companymilitary?: No Has patient ever served in combat?: No Did You Receive Any Psychiatric Treatment/Services While in Equities traderthe Military?: No Are There Guns or Other Weapons in Your Home?: No  Financial Resources:   Financial resources: Income from employment, Private insurance Does patient have a representative payee or guardian?: No  Alcohol/Substance Abuse:   What has been your use of drugs/alcohol within the last 12 months?: Alcohol - 3-4 drinks daily for the last 1 year.  Pt reports her alcohol use is a problem to her.   If attempted suicide, did drugs/alcohol play a role in this?: No Alcohol/Substance Abuse Treatment Hx: Denies past history Has alcohol/substance abuse ever caused legal problems?: No  Social Support System:   Forensic psychologistatient's Community Support System: Poor Describe Community Support System: Pt reports her family isn't supportive like she'd like and she has friends but they aren't the best influence Type of faith/religion: Catholic How does patient's faith help to cope with current illness?: prayer  Leisure/Recreation:   Leisure and Hobbies: working in the yard  Strengths/Needs:   What things does  the patient do well?: math In what areas  does patient struggle / problems for patient: depression, anxiety, SI  Discharge Plan:   Does patient have access to transportation?: Yes Will patient be returning to same living situation after discharge?: Yes Currently receiving community mental health services: No If no, would patient like referral for services when discharged?: Yes (What county?) [place] follow [place]) Does patient have financial barriers related to discharge medications?: No  Summary/Recommendations:   Summary and Recommendations (to be completed by the evaluator): Patient is a 30 year old female, with a diagnosis of Mood Disorder due to known physiological disorder, on admission.  Patient presented to the hospital with depressive symptoms and suicidal ideation.  Patient reports primary trigger for admission was a recent break up and feeling a lack of support. Patient will benefit from crisis stabilization, medication evaluation, group therapy and psycho education in addition to case management for discharge planning. At discharge, it is recommended that patient remain compliant with established discharge plan and continued treatment.    Pt presents with anxious mood and affect.  Pt paced the room for most of this assessment.  Pt reports contemplating if she's being irrational for expecting her family to be more supportive.  [place] and can return home there.  Pt is interested in a referral for outpatient therapy and medication after discharge.  Pt states that [facility] was recommended to her for therapy.  Pt states that she'd also be interested in further inpatient treatment for alcohol abuse.  Discharge Process and Patient Expectations information sheet signed by patient, witnessed by writer and inserted in patient's shadow chart.  Pt is a smoker but is not interested in [employer] [facility] as she reports already being set up with them.     Leona SingletonHarvey, Faylene Allerton Nicole. 01/05/2016

## 2016-01-05 NOTE — Plan of Care (Signed)
Problem: Education: Goal: Knowledge of the prescribed therapeutic regimen will improve Outcome: Progressing Nurse discussed depression/anxiety/coping skills with patient.    

## 2016-01-05 NOTE — Progress Notes (Signed)
[facility] Group Notes:  (Nursing/MHT/Case Management/Adjunct)  Date:  01/05/2016  Time:  9:33 PM  Type of Therapy:  Psychoeducational Skills  Participation Level:  Active  Participation Quality:  Appropriate  Affect:  Appropriate  Cognitive:  Appropriate  Insight:  Good and Improving  Engagement in Group:  Engaged and Improving  Modes of Intervention:  Education  Summary of Progress/Problems: The patient was forthcoming in group as compared to last evening. She stated that she tried to play cards with her peers today. In addition, she walked and did jumping jacks in the hallway to burn off some energy. Next, she received a set of clean clothing from her family earlier in the day. As for the theme of the day, her coping skill will be to listen to music.  Hazle CocaGOODMAN, Argus Caraher S 01/05/2016, 9:33 PM

## 2016-01-05 NOTE — Progress Notes (Signed)
Patient has been up and active on the unit, attended group this evening and has voiced no complaints. Patient currently denies having pain, -si/hi/a/v hall. Support and encouragement offered, safety maintained on unit, will continue to monitor.  

## 2016-01-06 DIAGNOSIS — F1020 Alcohol dependence, uncomplicated: Secondary | ICD-10-CM | POA: Clinically undetermined

## 2016-01-06 DIAGNOSIS — Z8659 Personal history of other mental and behavioral disorders: Secondary | ICD-10-CM

## 2016-01-06 LAB — TSH: TSH: 0.692 u[IU]/mL (ref 0.350–4.500)

## 2016-01-06 MED ORDER — CITALOPRAM HYDROBROMIDE 10 MG PO TABS
10.0000 mg | ORAL_TABLET | Freq: Every day | ORAL | Status: DC
  Administered 2016-01-06 – 2016-01-08 (×3): 10 mg via ORAL
  Filled 2016-01-06: qty 1
  Filled 2016-01-06: qty 7
  Filled 2016-01-06 (×4): qty 1

## 2016-01-06 NOTE — Tx Team (Signed)
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 acetaminophen (TYLENOL) tablet 650 mg  650 mg Oral Q6H PRN Ethelene Hal, NP   650 mg at 01/04/16 2059  . alum & mag hydroxide-simeth (MAALOX/MYLANTA) 200-200-20 MG/5ML suspension 30 mL  30 mL Oral Q4H PRN Ethelene Hal, NP      . chlordiazePOXIDE (LIBRIUM) capsule 25 mg  25 mg Oral Q6H PRN Derrill Center, NP   25 mg at 01/04/16 2059  . hydrOXYzine (ATARAX/VISTARIL) tablet 25 mg  25 mg Oral Q6H PRN Derrill Center, NP   25 mg at 01/05/16 1104  . loperamide (IMODIUM) capsule 2-4 mg  2-4 mg Oral PRN Derrill Center, NP      . magnesium hydroxide (MILK OF MAGNESIA) suspension 30 mL  30 mL Oral Daily PRN Ethelene Hal, NP      . mirtazapine (REMERON) tablet 7.5 mg  7.5 mg Oral QHS Derrill Center, NP   7.5 mg at 01/05/16 2111  . multivitamin with minerals tablet 1 tablet  1 tablet Oral Daily Derrill Center, NP   1 tablet at 01/06/16 [phone]  . nicotine polacrilex (NICORETTE) gum 2 mg  2 mg Oral PRN Jenne Campus, MD   2 mg at 01/06/16 2482  . ondansetron (ZOFRAN-ODT) disintegrating tablet 4 mg  4 mg Oral Q6H PRN Derrill Center, NP      . thiamine (VITAMIN B-1) tablet 100 mg  100 mg Oral Daily Derrill Center, NP   100 mg at 01/06/16 0808  . traZODone (DESYREL) tablet 50 mg  50 mg Oral QHS PRN Ethelene Hal, NP   50 mg at 01/05/16 2111    PTA Medications: Prescriptions Prior to Admission  Medication Sig Dispense Refill  Last Dose  . ALPRAZolam (XANAX) 0.5 MG tablet Take 0.5 mg by mouth 2 (two) times daily.   01/03/2016 at Unknown time  . amphetamine-dextroamphetamine (ADDERALL) 10 MG tablet Take 30 mg by mouth daily with breakfast.   01/03/2016 at Unknown time    Treatment Modalities: Medication Management, Group therapy, Case management,  1 to 1 session with clinician, Psychoeducation, Recreational therapy.   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, unspecified Long Term Goal(s): Improvement in symptoms so as ready for discharge  Short Term Goals: Ability to identify changes in lifestyle to reduce recurrence of condition will improve Ability to demonstrate self-control will improve Ability to maintain clinical measurements within normal limits will improve Ability to identify changes in lifestyle to reduce recurrence of condition will improve Ability to disclose and discuss suicidal ideas Compliance with prescribed medications will improve Ability to identify triggers associated with substance abuse/mental health issues will improve  Medication Management: Evaluate patient's response, side effects, and tolerance of medication regimen.  Therapeutic Interventions: 1 to 1 sessions, Unit Group sessions and Medication administration.  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, unspecified Active Problems:   Severe episode of recurrent major  depressive disorder, without psychotic features ([place])   Long Term Goal(s): Improvement in symptoms so as ready for discharge  Short Term Goals: Ability to identify changes in lifestyle to reduce recurrence of condition will improve Ability to demonstrate self-control will improve Ability to maintain clinical measurements within normal limits will improve Ability to identify changes  in lifestyle to reduce recurrence of condition will improve Ability to disclose and discuss suicidal ideas Compliance with prescribed medications will improve Ability to identify triggers associated with substance abuse/mental health issues will improve  Medication Management: Evaluate patient's response, side effects, and tolerance of medication regimen.  Therapeutic Interventions: 1 to 1 sessions, Unit Group sessions and Medication administration.  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 RN will report any adverse and/or side effects to prescribing provider.  Therapeutic Interventions: 1 on 1 counseling sessions, Psychoeducation, Medication administration, Evaluate responses to treatment, Monitor vital signs and CBGs as ordered, Perform/monitor CIWA, COWS, AIMS and Fall Risk screenings as ordered, Perform wound care treatments as ordered.  Evaluation of Outcomes: Adequate for Discharge   Recreational Therapy Treatment Plan for Primary Diagnosis: Severe episode of recurrent major depressive disorder, without psychotic features ([place]) Long Term Goal(s): LTG- Patient will participate in recreation therapy tx in at least 2 group sessions without prompting from LRT.  Short Term Goals:  Patient will be able to identify at least 5 coping skills for admitting dx by conclusion of recreation therapy tx.  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  Short Term Goals: Engage patient in aftercare planning with referrals and resources  Therapeutic Interventions: Assess for all discharge needs, 1 to 1 time with Social worker, Explore available resources and support systems, Assess for adequacy in community support network, Educate family and significant other(s) on suicide prevention, Complete Psychosocial Assessment, Interpersonal group therapy.  Evaluation of Outcomes: Met  Plans to return home, follow up outpt   Progress in Treatment: Attending groups: Yes Participating in groups: Yes Taking medication as prescribed: Yes Toleration medication: Yes, no side effects reported at this time Family/Significant other contact made: No Patient understands diagnosis: Yes AEB asking for help with mood, substance abuse Discussing patient identified problems/goals with staff: Yes Medical problems stabilized or resolved: Yes Denies suicidal/homicidal ideation: Yes Issues/concerns per patient self-inventory: None Other: N/A  New problem(s) identified: None identified at this time.   New Short Term/Long Term Goal(s): None identified at this time.   Discharge Plan or Barriers:   Reason for Continuation of Hospitalization: Medication stabilization   Estimated Length of Stay: D/C tomorrow  Attendees: Patient: 01/06/2016  8:35 AM  Physician: Ursula Alert, MD 01/06/2016  8:35 AM  Nursing: Hoy Register, RN 01/06/2016  8:35 AM  RN Care Manager: Lars Pinks, RN 01/06/2016  8:35 AM  Social Worker: Ripley Fraise 01/06/2016  8:35  AM  Recreational Therapist: British Moyd  01/06/2016  8:35 AM  Other: Norberto Sorenson  01/06/2016  8:35 AM  Other:  01/06/2016  8:35 AM    Scribe for Treatment Team:  Roque Lias LCSW 01/06/2016 8:35 AM

## 2016-01-06 NOTE — Progress Notes (Signed)
Adult Psychoeducational Group Note  Date:  01/06/2016 Time:  10:00 PM  Group Topic/Focus:  Wrap-Up Group:   The focus of this group is to help patients review their daily goal of treatment and discuss progress on daily workbooks.   Participation Level:  Active  Participation Quality:  Appropriate  Affect:  Appropriate  Cognitive:  Alert  Insight: Appropriate  Engagement in Group:  Engaged  Modes of Intervention:  Discussion  Additional Comments:  Patient's goal for today was to attend groups.  Amanda Wagner 01/06/2016, 10:00 PM

## 2016-01-06 NOTE — Progress Notes (Addendum)
[facility]  01/06/2016 3:32 PM Amanda Wagner  MRN:  [id number] Subjective:  Patient reports " I am abusing a lot of alcohol. I have been struggling with depression and anxiety since middle school, now I am having some issues with my boyfriend and I think I am trying to cope this way.'  Objective:Amanda Wagner is seen as anxious , depressed. Pt discussed with treatment team. Pt reports ongoing alcohol abuse. Pt also positive - UDS- for stimulants , bzd. Pt reports a hx of ADD - reports her PMD prescribes stimulants to her. Pt did not elaborate on her bzd abuse - did not mention it to Clinical research associatewriter.  Review of [place] controlled substance database showed that pt has not been prescribed Adderall in the past year. Its likely she is minimizing her abuse. Pt offered to be started on Celexa for affective sx. Pt also offered referral to substance abuse treatment program. Encouraged attending groups.   Principal Problem: Severe episode of recurrent major depressive disorder, without psychotic features (HCC) Diagnosis:   Patient Active Problem List   Diagnosis Date Noted  . Severe episode of recurrent major depressive disorder, without psychotic features (HCC) [F33.2]     Priority: High  . Alcohol use disorder, moderate, dependence (HCC) [F10.20] 01/06/2016  . History of ADHD [Z86.59] 01/06/2016   Total Time spent with patient: 25 minutes  Past Psychiatric History: Please see H&P.   Past Medical History: Please see H&P.  Past Surgical History:  Procedure Laterality Date  . WISDOM TOOTH EXTRACTION     Family History: Please see H&P.  Family Psychiatric  History:Please see H&P.  Social History: Please see H&P.  History  Alcohol Use  . Yes    Comment: reports 'excessively', does not specify     History  Drug use: Unknown    Social History   Social History  . Marital status: Unknown    Spouse name: N/A  . Number of children: N/A  . Years of education: N/A   Social History Main  Topics  . Smoking status: Former Games developermoker  . Smokeless tobacco: Never Used     Comment: reports 'I'm using nicorette to quit'  . Alcohol use Yes     Comment: reports 'excessively', does not specify  . Drug use: Unknown  . Sexual activity: Not Asked   Other Topics Concern  . None   Social History Narrative  . None   Additional Social History:                         Sleep: Fair  Appetite:  Fair  Current Medications: Current Facility-Administered Medications  Medication Dose Route Frequency Provider Last Rate Last Dose  . acetaminophen (TYLENOL) tablet 650 mg  650 mg Oral Q6H PRN [name], NP   650 mg at 01/04/16 2059  . alum & mag hydroxide-simeth (MAALOX/MYLANTA) 200-200-20 MG/5ML suspension 30 mL  30 mL Oral Q4H PRN [name], NP      . chlordiazePOXIDE (LIBRIUM) capsule 25 mg  25 mg Oral Q6H PRN Oneta Rackanika N Lewis, NP   25 mg at 01/04/16 2059  . citalopram (CELEXA) tablet 10 mg  10 mg Oral Daily Lamarion Mcevers, MD      . hydrOXYzine (ATARAX/VISTARIL) tablet 25 mg  25 mg Oral Q6H PRN Oneta Rackanika N Lewis, NP   25 mg at 01/05/16 1104  . loperamide (IMODIUM) capsule 2-4 mg  2-4 mg Oral PRN Oneta Rackanika N Lewis, NP      .  magnesium hydroxide (MILK OF MAGNESIA) suspension 30 mL  30 mL Oral Daily PRN [name], NP      . mirtazapine (REMERON) tablet 7.5 mg  7.5 mg Oral QHS Oneta Rackanika N Lewis, NP   7.5 mg at 01/05/16 2111  . multivitamin with minerals tablet 1 tablet  1 tablet Oral Daily Oneta Rackanika N Lewis, NP   1 tablet at 01/06/16 [phone]  . nicotine polacrilex (NICORETTE) gum 2 mg  2 mg Oral PRN Craige CottaFernando A Cobos, MD   2 mg at 01/06/16 1053  . ondansetron (ZOFRAN-ODT) disintegrating tablet 4 mg  4 mg Oral Q6H PRN Oneta Rackanika N Lewis, NP      . thiamine (VITAMIN B-1) tablet 100 mg  100 mg Oral Daily Oneta Rackanika N Lewis, NP   100 mg at 01/06/16 [id number]    Lab Results: No results found for this or any previous visit (from the past 48 hour(s)).  Blood Alcohol level:  Lab Results   Component Value Date   ETH 58 (H) 01/02/2016    Metabolic Disorder Labs: No results found for: HGBA1C, MPG No results found for: PROLACTIN No results found for: CHOL, TRIG, HDL, CHOLHDL, VLDL, LDLCALC  Physical Findings: AIMS: Facial and Oral Movements Muscles of Facial Expression: None, normal Lips and Perioral Area: None, normal Jaw: None, normal Tongue: None, normal,Extremity Movements Upper (arms, wrists, hands, fingers): None, normal Lower (legs, knees, ankles, toes): None, normal, Trunk Movements Neck, shoulders, hips: None, normal, Overall Severity Severity of abnormal movements (highest score from questions above): None, normal Incapacitation due to abnormal movements: None, normal Patient's awareness of abnormal movements (rate only patient's report): No Awareness, Dental Status Current problems with teeth and/or dentures?: No Does patient usually wear dentures?: No  CIWA:  CIWA-Ar Total: 1 COWS:  COWS Total Score: 2  Musculoskeletal: Strength & Muscle Tone: within normal limits Gait & Station: normal Patient leans: N/A  Psychiatric Specialty Exam: Physical Exam  Nursing note and vitals reviewed.   Review of Systems  Psychiatric/Behavioral: Positive for depression and substance abuse. The patient is nervous/anxious.   All other systems reviewed and are negative.   Blood pressure (!) 107/51, pulse 95, temperature 98.7 F (37.1 C), temperature source Oral, resp. rate 16, SpO2 98 %.There is no height or weight on file to calculate BMI.  General Appearance: Casual  Eye Contact:  Fair  Speech:  Clear and Coherent  Volume:  Normal  Mood:  Anxious and Depressed  Affect:  Depressed  Thought Process:  Linear and Descriptions of Associations: Circumstantial  Orientation:  Full (Time, Place, and Person)  Thought Content:  Rumination  Suicidal Thoughts:  No  Homicidal Thoughts:  No  Memory:  Immediate;   Fair Recent;   Fair Remote;   Fair  Judgement:  Impaired   Insight:  Fair  Psychomotor Activity:  Restlessness  Concentration:  Concentration: Fair and Attention Span: Fair  Recall:  FiservFair  Fund of Knowledge:  Fair  Language:  Fair  Akathisia:  No  Handed:  Right  AIMS (if indicated):     Assets:  Communication Skills Resilience Social Support  ADL's:  Intact  Cognition:  WNL  Sleep:  Number of Hours: 6.5      Treatment Plan Summary: Severe episode of recurrent major depressive disorder, without psychotic features (HCC) unstable   Will continue today 01/06/16  plan as below except where it is noted.  Daily contact with patient to assess and evaluate symptoms and progress in treatment and Medication management  For Depressive sx: Start Celexa 10 mg po daily.  For substance use disorder;( alcohol, UDS also positive for bzd, stimulants) Provided substance abuse counseling.  [place] Controlled Substance- could not verify adderall prescription.  Librium Protocol (CIWA's). Refer to substance abuse rehab if she is motivated to do so.  Insomnia:  Remeron 7.5 m g po qhs.  Will continue to monitor vitals ,medication compliance and treatment side effects while patient is here.   Reviewed labs:will order TSH.  CSW will continue working on disposition.   Patient to participate in therapeutic milieu  Jamin Humphries, MD 01/06/2016, 3:32 PM

## 2016-01-06 NOTE — Progress Notes (Signed)
Recreation Therapy Notes  Date: 01/06/16 Time: 1000 Location: 500 Hall Dayroom  Group Topic: Coping Skills  Goal Area(s) Addresses:  Pt will be able to positive coping skills. Pt will be able to identify the importance of using positive coping skills post d/c.  Behavioral Response:  Engaged  Intervention: Scientist, clinical (histocompatibility and immunogenetics)Construction paper, markers  Activity: Bridge the McK[employer]ap.  Patients were to draw to connecting mountains on Wagner Education officer, museumpiece of construction paper.  On the first hump, patients were to identify what brought them to the hospital.  On the second hump, patients were to identify what they hope to accomplish.  Next, patients were to draw Wagner line connecting the two hump.  On that line, patients were to identify the coping skills that would help them reach their goal.   Education: Coping Skills, Discharge Planning.   Education Outcome: Acknowledges understanding/In group clarification offered/Needs additional education.   Clinical Observations/Feedback: Pt expressed hiding her issues, relationships, suicidal thoughts, anxiety and abusing alcohol are some of the things that got her here.  Pt stated she wants to feel better about herself and be healthy.  Pt stated she could reach that goal by using positive affirmations, therapy, take medications and prayer.  Pt expressed using these coping skills will "help me feel better about herself mentally".   Amanda Wagner, LRT/CTRS         Amanda RancherLindsay, Amanda Wagner 01/06/2016 12:59 PM

## 2016-01-06 NOTE — Progress Notes (Signed)
  D: When asked about her day pt stated, "anxious, anxious, anxious. I don't know what I'm charged with; If I'm charged. I haven't been able to see my tickets. I don't know what's going on". Encouraged pt to speak with her CM in hopes of getting more info. Pt has no other questions or concerns.    A:  Support and encouragement was offered. 15 min checks continued for safety.  R: Pt remains safe.

## 2016-01-06 NOTE — Progress Notes (Signed)
D:  Patient's self inventory sheet, patient has poor sleep, sleep medication not helpful.  Good appetite, low energy level, good concentration.  Denied depression, hopeless and anxiety.  Denied withdrawals.  Denied SI.  Denied physical problems.  Denied physical pain.  Goal is to discharge.  Plans to attend groups.  Plans to discharge home. A:  Medications administered per MD orders.  Emotional support and encouragement given patient. R:  Denied SI and HI, contracts for safety.  Denied A/V hallucinations.  Safety maintained with 15 minute checks.

## 2016-01-06 NOTE — Plan of Care (Signed)
Problem: Coping: Goal: Ability to cope will improve Outcome: Progressing Nurse discussed depression/coping skills with patient.    

## 2016-01-06 NOTE — Progress Notes (Signed)
Recreation Therapy Notes  INPATIENT RECREATION THERAPY ASSESSMENT  Patient Details Name: Amanda Wagner MRN: [id number] DOB: December 22, 1985 Today's Date: 01/06/2016  Patient Stressors: Relationship, Work, Other (Comment) (Life in general)  Pt stated she was here for suicidal thoughts.  Coping Skills:   Avoidance, Exercise, Art/Dance, Talking, Music  Personal Challenges: Anger, Communication, Concentration, Decision-Making, Expressing Yourself, Problem-Solving, Relationships, Self-Esteem/Confidence, Stress Management, Substance Abuse, Trusting Others, Work Nutritional therapisterformance  Leisure Interests (2+):  Art - Curatoraint, Music - Listen, Individual - TV (exercise)  Awareness of Community Resources:  Yes  Community Resources:  [employer], Other (Comment) Risk manager( [employer])  Current Use: Yes  Patient Strengths:  Good at Texas Instrumentsmath; analytical  Patient Identified Areas of Improvement:  Anger issues; imulsive behavior  Current Recreation Participation:  Everyday  Patient Goal for Hospitalization:  "Think about what I should do next, focus on being sober"  [place]  [place]idence[place]   Current SI (including self-harm):  No  Current HI:  No  Consent to Intern Participation: N/A   Caroll RancherMarjette Tavi Gaughran, LRT/CTRS  Caroll RancherLindsay, Mervin Ramires A 01/06/2016, 3:06 PM

## 2016-01-06 NOTE — [facility] Group Notes (Signed)
[facility] LCSW Group Therapy  01/06/2016 1:15 pm  Type of Therapy: Process Group Therapy  Participation Level:  Active  Participation Quality:  Appropriate  Affect:  Flat  Cognitive:  Oriented  Insight:  Improving  Engagement in Group:  Limited  Engagement in Therapy:  Limited  Modes of Intervention:  Activity, Clarification, Education, Problem-solving and Support  Summary of Progress/Problems: Today's group addressed the issue of overcoming obstacles.  Patients were asked to identify their biggest obstacle post d/c that stands in the way of their on-going success, and then problem solve as to how to manage this.Stayed the entire time, engaged throughout.  Initially guarded.  "I want to be the best mom I can be.  I haven't been taking care of myself the way I need to so that I can take care of my daughter."  Cryptic.  Later, admitted that alcohol is a problem "that leads to other things."  Has been attending AA meetings, but has not gotten a sponsor.  Amanda Wagner, Amanda Wagner 01/06/2016   3:25 PM

## 2016-01-07 MED ORDER — TRAZODONE HCL 50 MG PO TABS
50.0000 mg | ORAL_TABLET | Freq: Every evening | ORAL | Status: DC | PRN
  Administered 2016-01-07: 50 mg via ORAL
  Filled 2016-01-07: qty 1

## 2016-01-07 NOTE — Progress Notes (Signed)
Patient has been agitated after finding out she would not discharge today.  Patient began to refuse medications and refuse to engaged in unit activities. Staff talked with patient and patient stated I would rather make medication decisions with my doctor in the community.  I don't think I need all of these new medications.   Assess patient for safety , offer medications as prescribed, engage patient in 1:1 staff talks.   Continue to monitor as prescribed.

## 2016-01-07 NOTE — Progress Notes (Signed)
  D: Pt was laying in bed during the assessment. Informed the writer that she's scheduled for a "follow up appt at the [facility]."  However, she's "comtemplating a rehab I/P to get it out of her system".  However, pt reiterated several times, "I'm ready to go".  Pt has no other questions or concerns.    A:  Support and encouragement was offered. 15 min checks continued for safety.  R: Pt remains safe.

## 2016-01-07 NOTE — [facility] Suicide Risk Assessment (Signed)
[facility] INPATIENT:  Family/Significant Other Suicide Prevention Education  Suicide Prevention Education:  Education Completed; Derek MoundSusan Tetrault, [phone] has been identified by the patient as the family member/significant other with whom the patient will be residing, and identified as the person(s) who will aid the patient in the event of a mental health crisis (suicidal ideations/suicide attempt).  With written consent from the patient, the family member/significant other has been provided the following suicide prevention education, prior to the and/or following the discharge of the patient.  The suicide prevention education provided includes the following:  Suicide risk factors  Suicide prevention and interventions  National Suicide Hotline telephone number  [facility] assessment telephone number  [facility] Emergency Assistance 911  [facility] and/or Residential Mobile Crisis Unit telephone number  Request made of family/significant other to:  Remove weapons (e.g., guns, rifles, knives), all items previously/currently identified as safety concern.    Remove drugs/medications (over-the-counter, prescriptions, illicit drugs), all items previously/currently identified as a safety concern.  The family member/significant other verbalizes understanding of the suicide prevention education information provided.  The family member/significant other agrees to remove the items of safety concern listed above. Lives in [place]-limited contact.  Ida RogueRodney B Georgianna Band 01/07/2016, 8:56 AM

## 2016-01-07 NOTE — Progress Notes (Signed)
Adult Psychoeducational Group Note  Date:  01/07/2016 Time:  8:36 PM  Group Topic/Focus:  Wrap-Up Group:   The focus of this group is to help patients review their daily goal of treatment and discuss progress on daily workbooks.   Participation Level:  Active  Participation Quality:  Appropriate  Affect:  Appropriate  Cognitive:  Appropriate  Insight: Appropriate  Engagement in Group:  Engaged  Modes of Intervention:  Discussion  Additional Comments: The patient expressed that she attended groups.The patient also said that she had a head was hurting and rates today a 6. Octavio Mannshigpen, Talyia Allende Lee 01/07/2016, 8:36 PM

## 2016-01-07 NOTE — Progress Notes (Signed)
Recreation Therapy Notes  Date: 01/07/16 Time: 1000 Location: 500 Hall Dayroom  Group Topic: Self-Esteem  Goal Area(s) Addresses:  Patient will identify positive ways to increase self-esteem. Patient will verbalize benefit of increased self-esteem.  Behavioral Response: Engaged  Intervention: Blank mask worksheet, colored pencils  Activity: How I See Me.  Patients were given a sheet with a blank mask on it.  Patients were asked to write on the back of the sheet how they feel other people view them.  On the blank mask, patients were asked to design how they view themselves.  Patients could use pictures and words to express their thoughts.  Education:  Self-Esteem, Building control surveyorDischarge Planning.   Education Outcome: Acknowledges education/In group clarification offered/Needs additional education  Clinical Observations/Feedback: Pt stated she was a little frustrated and expressed it by drawing on her sheet couldn't go home because of lack of sleep last night.  Pt did state that she loves herself and just wants to live her life.   Caroll RancherMarjette Tiffane Sheldon, LRT/CTRS         Caroll RancherLindsay, Harmonie Verrastro A 01/07/2016 11:45 AM

## 2016-01-07 NOTE — [facility] Group Notes (Signed)
[facility] LCSW Group Therapy  01/07/2016 , 2:34 PM   Type of Therapy:  Group Therapy  Participation Level:  Active  Participation Quality:  Attentive  Affect:  Appropriate  Cognitive:  Alert  Insight:  Improving  Engagement in Therapy:  Engaged  Modes of Intervention:  Discussion, Exploration and Socialization  Summary of Progress/Problems: Today's group focused on the term Diagnosis.  Participants were asked to define the term, and then pronounce whether it is a negative, positive or neutral term. Stayed for most of group, engaged throughout.  Talked about her experience of flunking out of college the first time, but then going back and completing her degree, even though she was not getting financial aid the second time around.  Cited family support and her stubbornness as things that have gotten her through trying times.  Daryel Geraldorth, Amanda Wagner 01/07/2016 , 2:34 PM

## 2016-01-07 NOTE — Progress Notes (Addendum)
[facility]  01/07/2016 2:25 PM Su HiltLinsey Wagner  MRN:  [id number] Subjective:  Patient reports " I could not sleep last night since my room mate was snoring. They gave me an earplug and it helped a little."    Objective:Amanda Wagner is seen as anxious and irritable today. Pt with sleep issues last night and it is likely that her irritability , anger issues and mood lability may be due to her poor sleep. Pt reports she is motivated to get help with her alcohol abuse .Pt also with UDS positive for stimulants and BZD- and review of  controlled substance database showed that pt has not been prescribed Adderall or BZD.  Pt offered reassurance, discussed that her room can be changed tonight and her medications readjusted. Pt not willing to discuss further medication changes. Hence will add Trazodone PRN for sleep .    Principal Problem: Severe episode of recurrent major depressive disorder, without psychotic features (HCC) Diagnosis:   Patient Active Problem List   Diagnosis Date Noted  . Severe episode of recurrent major depressive disorder, without psychotic features (HCC) [F33.2]     Priority: High  . Alcohol use disorder, moderate, dependence (HCC) [F10.20] 01/06/2016  . History of ADHD [Z86.59] 01/06/2016   Total Time spent with patient: 25 minutes  Past Psychiatric History: Please see H&P.   Past Medical History: Please see H&P.  Past Surgical History:  Procedure Laterality Date  . WISDOM TOOTH EXTRACTION     Family History: Please see H&P.  Family Psychiatric  History:Please see H&P.  Social History: Please see H&P.  History  Alcohol Use  . Yes    Comment: reports 'excessively', does not specify     History  Drug use: Unknown    Social History   Social History  . Marital status: Unknown    Spouse name: N/A  . Number of children: N/A  . Years of education: N/A   Social History Main Topics  . Smoking status: Former Games developermoker  . Smokeless tobacco:  Never Used     Comment: reports 'I'm using nicorette to quit'  . Alcohol use Yes     Comment: reports 'excessively', does not specify  . Drug use: Unknown  . Sexual activity: Not Asked   Other Topics Concern  . None   Social History Narrative  . None   Additional Social History:                         Sleep: Poor  Appetite:  Fair  Current Medications: Current Facility-Administered Medications  Medication Dose Route Frequency Provider Last Rate Last Dose  . acetaminophen (TYLENOL) tablet 650 mg  650 mg Oral Q6H PRN [name], NP   650 mg at 01/04/16 2059  . alum & mag hydroxide-simeth (MAALOX/MYLANTA) 200-200-20 MG/5ML suspension 30 mL  30 mL Oral Q4H PRN [name], NP      . chlordiazePOXIDE (LIBRIUM) capsule 25 mg  25 mg Oral Q6H PRN Oneta Rackanika N Lewis, NP   25 mg at 01/04/16 2059  . citalopram (CELEXA) tablet 10 mg  10 mg Oral Daily Jomarie LongsSaramma Banita Lehn, MD   10 mg at 01/07/16 0819  . hydrOXYzine (ATARAX/VISTARIL) tablet 25 mg  25 mg Oral Q6H PRN Oneta Rackanika N Lewis, NP   25 mg at 01/06/16 2008  . loperamide (IMODIUM) capsule 2-4 mg  2-4 mg Oral PRN Oneta Rackanika N Lewis, NP      . magnesium hydroxide (MILK OF  MAGNESIA) suspension 30 mL  30 mL Oral Daily PRN [name], NP      . mirtazapine (REMERON) tablet 7.5 mg  7.5 mg Oral QHS Oneta Rackanika N Lewis, NP   7.5 mg at 01/06/16 2314  . multivitamin with minerals tablet 1 tablet  1 tablet Oral Daily Oneta Rackanika N Lewis, NP   1 tablet at 01/07/16 [id number]  . nicotine polacrilex (NICORETTE) gum 2 mg  2 mg Oral PRN Craige CottaFernando A Cobos, MD   2 mg at 01/06/16 2008  . ondansetron (ZOFRAN-ODT) disintegrating tablet 4 mg  4 mg Oral Q6H PRN Oneta Rackanika N Lewis, NP      . thiamine (VITAMIN B-1) tablet 100 mg  100 mg Oral Daily Oneta Rackanika N Lewis, NP   100 mg at 01/07/16 0819  . traZODone (DESYREL) tablet 50 mg  50 mg Oral QHS PRN Jomarie LongsSaramma Woodie Trusty, MD        Lab Results:  Results for orders placed or performed during the hospital encounter of  01/03/16 (from the past 48 hour(s))  TSH     Status: None   Collection Time: 01/06/16  6:56 PM  Result Value Ref Range   TSH 0.692 0.350 - 4.500 uIU/mL    Comment: Performed by a 3rd Generation assay with a functional sensitivity of <=0.01 uIU/mL. [facility]     Blood Alcohol level:  Lab Results  Component Value Date   ETH 58 (H) 01/02/2016    Metabolic Disorder Labs: No results found for: HGBA1C, MPG No results found for: PROLACTIN No results found for: CHOL, TRIG, HDL, CHOLHDL, VLDL, LDLCALC  Physical Findings: AIMS: Facial and Oral Movements Muscles of Facial Expression: None, normal Lips and Perioral Area: None, normal Jaw: None, normal Tongue: None, normal,Extremity Movements Upper (arms, wrists, hands, fingers): None, normal Lower (legs, knees, ankles, toes): None, normal, Trunk Movements Neck, shoulders, hips: None, normal, Overall Severity Severity of abnormal movements (highest score from questions above): None, normal Incapacitation due to abnormal movements: None, normal Patient's awareness of abnormal movements (rate only patient's report): No Awareness, Dental Status Current problems with teeth and/or dentures?: No Does patient usually wear dentures?: No  CIWA:  CIWA-Ar Total: 1 COWS:  COWS Total Score: 2  Musculoskeletal: Strength & Muscle Tone: within normal limits Gait & Station: normal Patient leans: N/A  Psychiatric Specialty Exam: Physical Exam  Nursing note and vitals reviewed.   Review of Systems  Psychiatric/Behavioral: Positive for substance abuse. The patient is nervous/anxious and has insomnia.   All other systems reviewed and are negative.   Blood pressure 96/73, pulse 72, temperature 98.1 F (36.7 C), temperature source Oral, resp. rate 18, SpO2 98 %.There is no height or weight on file to calculate BMI.  General Appearance: Casual  Eye Contact:  Fair  Speech:  Clear and Coherent  Volume:  Increased   Mood:  Angry, Anxious, Depressed and Irritable  Affect:  Labile  Thought Process:  Linear and Descriptions of Associations: Circumstantial  Orientation:  Full (Time, Place, and Person)  Thought Content:  Rumination  Suicidal Thoughts:  No however is still very irritable and labile and impulsive - hence is likely a potential danger to self or others  Homicidal Thoughts:  No  Memory:  Immediate;   Fair Recent;   Fair Remote;   Fair  Judgement:  Impaired  Insight:  Fair  Psychomotor Activity:  Restlessness  Concentration:  Concentration: Fair and Attention Span: Fair  Recall:  FiservFair  Fund of Knowledge:  Fair  Language:  Fair  Akathisia:  No  Handed:  Right  AIMS (if indicated):     Assets:  Communication Skills Resilience Social Support  ADL's:  Intact  Cognition:  WNL  Sleep:  Number of Hours: 5.75      Treatment Plan Summary:Patient with alcohol and other substance abuse issues , going through relational stressors , was found unresponsive in her car and had endorsed SI on admission. Pt continues to be labile , irritable this AM , also had poor sleep. Will continue treatment.  Severe episode of recurrent major depressive disorder, without psychotic features (HCC) unstable   Will continue today 01/07/16  plan as below except where it is noted.  Daily contact with patient to assess and evaluate symptoms and progress in treatment and Medication management   For Depressive sx: Started Celexa 10 mg po daily.Pt does not want further medication changes.  For substance use disorder;( alcohol, UDS also positive for bzd, stimulants) Provided substance abuse counseling.  [place] Controlled Substance- could not verify adderall prescription, BZD .  Librium Protocol (CIWA's). Refer to substance abuse rehab if she is motivated to do so.  Insomnia:  Remeron 7.5 m g po qhs. Add Trazodone 50 mg po qhs prn.  Will continue to monitor vitals ,medication compliance and treatment side effects  while patient is here.   Reviewed labs:TSH- wnl.  CSW will continue working on disposition.Patient to be referred to a substance abuse program.   Patient to participate in therapeutic milieu  Meshia Rau, MD 01/07/2016, 2:25 PM

## 2016-01-08 MED ORDER — CITALOPRAM HYDROBROMIDE 10 MG PO TABS: 10.0000 mg | ORAL_TABLET | Freq: Every day | ORAL | 0 refills | Status: DC

## 2016-01-08 MED ORDER — TRAZODONE HCL 100 MG PO TABS: 100.0000 mg | ORAL_TABLET | Freq: Every evening | ORAL | 0 refills | Status: DC | PRN

## 2016-01-08 MED ORDER — ACAMPROSATE CALCIUM 333 MG PO TBEC
666.0000 mg | DELAYED_RELEASE_TABLET | Freq: Three times a day (TID) | ORAL | Status: DC
  Administered 2016-01-08: 666 mg via ORAL
  Filled 2016-01-08: qty 42
  Filled 2016-01-08 (×2): qty 2
  Filled 2016-01-08: qty 42
  Filled 2016-01-08 (×3): qty 2
  Filled 2016-01-08: qty 42

## 2016-01-08 MED ORDER — TRAZODONE HCL 100 MG PO TABS
100.0000 mg | ORAL_TABLET | Freq: Every evening | ORAL | Status: DC | PRN
  Filled 2016-01-08: qty 7

## 2016-01-08 MED ORDER — ACAMPROSATE CALCIUM 333 MG PO TBEC: 666.0000 mg | DELAYED_RELEASE_TABLET | Freq: Three times a day (TID) | ORAL | 0 refills | Status: DC

## 2016-01-08 MED ORDER — NICOTINE POLACRILEX 2 MG MT GUM: 2.0000 mg | CHEWING_GUM | OROMUCOSAL | 0 refills | Status: DC | PRN

## 2016-01-08 NOTE — Progress Notes (Signed)
Recreation Therapy Notes  Date: 01/08/16 Time: 1020 Location: 500 [address] Dayroom  Group Topic: Leisure Education  Goal Area(s) Addresses:  Patient will identify positive leisure activities.  Patient will identify one positive benefit of participation in leisure activities.   Behavioral Response: Engaged  Intervention: 20 plastic cups, 2 soft spongy balls  Activity: Bowling.  LRT split the group into two teams.  Each person was given two chances to knock the "pins" over except if a person bowled a strike.  Each "pin" knocked over counted as one point.  The first team to reach 80 points won the game.  Education:  Leisure Education, Building control surveyorDischarge Planning  Education Outcome: Acknowledges education/In group clarification offered/Needs additional education  Clinical Observations/Feedback: Pt arrived late.  Pt was active and smiling during group.  Pt was social and encouraged peers.  Pt also talked about how she likes to go bowling with her daughter.   Caroll RancherMarjette Alfredo Collymore, LRT/CTRS         Caroll RancherLindsay, [place] Teters A 01/08/2016 12:07 PM

## 2016-01-08 NOTE — Progress Notes (Signed)
Pt discharged home with a bus pass. Pt was ambulatory, stable and appreciative at that time. All papers and prescriptions were given and valuables returned. Verbal understanding expressed. Denies SI/HI and A/VH. Pt given opportunity to express concerns and ask questions. 

## 2016-01-08 NOTE — Plan of Care (Signed)
Problem: [facility] Participation in Recreation Therapeutic Interventions Goal: STG-Patient will identify at least five coping skills for ** STG: Coping Skills - Patient will be able to identify at least 5 coping skills for suicidal thoughts by conclusion of recreation therapy tx  Outcome: Completed/Met Date Met: 01/08/16 Pt was able to identify coping skills at the completion of recreation therapy session.  Victorino Sparrow, LRT/CTRS

## 2016-01-08 NOTE — Discharge Summary (Signed)
Physician Discharge Summary Note  Patient:  Amanda Wagner is an 30 y.o., female MRN:  [id number] DOB:  1985-05-11 Patient phone:  [phone] (home)  Patient address:   [address] [place] [id number],  Total Time spent with patient: 30 minutes  Date of Admission:  01/03/2016 Date of Discharge: 01/08/2016  Reason for Admission:  Found unresponsive in car  Principal Problem: Severe episode of recurrent major depressive disorder, without psychotic features [facility](HCC) Discharge Diagnoses: Patient Active Problem List   Diagnosis Date Noted  . Alcohol use disorder, moderate, dependence (HCC) [F10.20] 01/06/2016  . History of ADHD [Z86.59] 01/06/2016  . Severe episode of recurrent major depressive disorder, without psychotic features (HCC) [F33.2]     Past Psychiatric History: see HPI  Past Medical History: History reviewed. No pertinent past medical history.  Past Surgical History:  Procedure Laterality Date  . WISDOM TOOTH EXTRACTION     Family History: History reviewed. No pertinent family history. Family Psychiatric  History: see HPI Social History:  History  Alcohol Use  . Yes    Comment: reports 'excessively', does not specify     History  Drug use: Unknown    Social History   Social History  . Marital status: Unknown    Spouse name: N/A  . Number of children: N/A  . Years of education: N/A   Social History Main Topics  . Smoking status: Former Games developermoker  . Smokeless tobacco: Never Used     Comment: reports 'I'm using nicorette to quit'  . Alcohol use Yes     Comment: reports 'excessively', does not specify  . Drug use: Unknown  . Sexual activity: Not Asked   Other Topics Concern  . None   Social History Narrative  . None    Hospital Course:  Amanda Wagner is an 30 y.o. female brought in by EMS under IVC. IVC stated: "Deputy found respondent unresponsive in car, it is assumed the respondent overdosed. The deputy busted out the window to get her out of the  car. The deputy stated she said, "I no longer want to live leave me alone and let me die."  It was reported that they found beer bottles and pill bottles (Ativan, Soma) on the floor.  Amanda Wagner was admitted for Severe episode of recurrent major depressive disorder, without psychotic features (HCC) and crisis management.  Patient was treated with medications with their indications listed below in detail under Medication List.  Medical problems were identified and treated as needed.  Home medications were restarted as appropriate.  Improvement was monitored by observation and Amanda Wagner daily report of symptom reduction.  Emotional and mental status was monitored by daily self inventory reports completed by Amanda Wagner and clinical staff.  Patient reported continued improvement, denied any new concerns.  Patient had been compliant on medications and denied side effects.  Support and encouragement was provided.    Amanda Wagner was evaluated by the treatment team for stability and plans for continued recovery upon discharge.  Patient was offered further treatment options upon discharge including Residential, Intensive Outpatient and Outpatient treatment. Patient will follow up with agency listed below for medication management and counseling.  Encouraged patient to maintain satisfactory support network and home environment.  Advised to adhere to medication compliance and outpatient treatment follow up.  Prescriptions provided.       Amanda Wagner motivation was an integral factor for scheduling further treatment.  Employment, transportation, bed availability, health status, family support, and any pending legal  issues were also considered during patient's hospital stay.  Upon completion of this admission the patient was both mentally and medically stable for discharge denying suicidal/homicidal ideation, auditory/visual/tactile hallucinations, delusional thoughts and paranoia.      Physical  Findings: AIMS: Facial and Oral Movements Muscles of Facial Expression: None, normal Lips and Perioral Area: None, normal Jaw: None, normal Tongue: None, normal,Extremity Movements Upper (arms, wrists, hands, fingers): None, normal Lower (legs, knees, ankles, toes): None, normal, Trunk Movements Neck, shoulders, hips: None, normal, Overall Severity Severity of abnormal movements (highest score from questions above): None, normal Incapacitation due to abnormal movements: None, normal Patient's awareness of abnormal movements (rate only patient's report): No Awareness, Dental Status Current problems with teeth and/or dentures?: No Does patient usually wear dentures?: No  CIWA:  CIWA-Ar Total: 0 COWS:  COWS Total Score: 2  Musculoskeletal: Strength & Muscle Tone: within normal limits Gait & Station: normal Patient leans: N/A  Psychiatric Specialty Exam: Physical Exam  Nursing note and vitals reviewed.   ROS  Blood pressure 101/61, pulse (!) 101, temperature 98.4 F (36.9 C), temperature source Oral, resp. rate 18, SpO2 98 %.There is no height or weight on file to calculate BMI.   Have you used any form of tobacco in the last 30 days? (Cigarettes, Smokeless Tobacco, Cigars, and/or Pipes): Patient Refused Screening  Has this patient used any form of tobacco in the last 30 days? (Cigarettes, Smokeless Tobacco, Cigars, and/or Pipes) Yes, N/A  Blood Alcohol level:  Lab Results  Component Value Date   ETH 58 (H) 01/02/2016    Metabolic Disorder Labs:  No results found for: HGBA1C, MPG No results found for: PROLACTIN No results found for: CHOL, TRIG, HDL, CHOLHDL, VLDL, LDLCALC  See Psychiatric Specialty Exam and Suicide Risk Assessment completed by Attending Physician prior to discharge.  Discharge destination:  Home  Is patient on multiple antipsychotic therapies at discharge:  No   Has Patient had three or more failed trials of antipsychotic monotherapy by history:   No  Recommended Plan for Multiple Antipsychotic Therapies: NA     Medication List    STOP taking these medications   ALPRAZolam 0.5 MG tablet Commonly known as:  XANAX   amphetamine-dextroamphetamine 10 MG tablet Commonly known as:  ADDERALL     TAKE these medications     Indication  acamprosate 333 MG tablet Commonly known as:  CAMPRAL Take 2 tablets (666 mg total) by mouth 3 (three) times daily with meals.  Indication:  Excessive Use of Alcohol   citalopram 10 MG tablet Commonly known as:  CELEXA Take 1 tablet (10 mg total) by mouth daily. Start taking on:  01/09/2016  Indication:  Depression   nicotine polacrilex 2 MG gum Commonly known as:  NICORETTE Take 1 each (2 mg total) by mouth as needed for smoking cessation.  Indication:  Nicotine Addiction   traZODone 100 MG tablet Commonly known as:  DESYREL Take 1 tablet (100 mg total) by mouth at bedtime as needed for sleep.  Indication:  Trouble Sleeping      Follow-up Information    [facility] Follow up on 01/09/2016.   Why:  Thursday at 10:45 for an 11:00 meeting with [name].  Please bring insurance information.  They also have a psychiatrist who can see you for your medication as well. Contact information: [address] [[id number]]           Follow-up recommendations:  Activity:  as tol Diet:  as  tol  Comments:  1.  Take all your medications as prescribed.   2.  Report any adverse side effects to outpatient provider. 3.  Patient instructed to not use alcohol or illegal drugs while on prescription medicines. 4.  In the event of worsening symptoms, instructed patient to call 911, the crisis hotline or go to nearest emergency room for evaluation of symptoms.  Signed: Lindwood QuaSheila May Tanyiah Laurich, NP [facility] 01/08/2016, 2:15 PM

## 2016-01-08 NOTE — [facility] Suicide Risk Assessment (Signed)
[facility]   Principal Problem: Severe episode of recurrent major depressive disorder, without psychotic features [facility](HCC) Discharge Diagnoses:  Patient Active Problem List   Diagnosis Date Noted  . Severe episode of recurrent major depressive disorder, without psychotic features (HCC) [F33.2]     Priority: High  . Alcohol use disorder, moderate, dependence (HCC) [F10.20] 01/06/2016  . History of ADHD [Z86.59] 01/06/2016    Total Time spent with patient: 30 minutes  Musculoskeletal: Strength & Muscle Tone: within normal limits Gait & Station: normal Patient leans: N/A  Psychiatric Specialty Exam: Review of Systems  Psychiatric/Behavioral: Positive for substance abuse. Negative for depression and hallucinations.  All other systems reviewed and are negative.   Blood pressure (!) 124/98, pulse (!) 104, temperature 98.1 F (36.7 C), temperature source Oral, resp. rate 18, SpO2 98 %.There is no height or weight on file to calculate BMI.  General Appearance: Casual  Eye Contact::  Fair  Speech:  Clear and Coherent409  Volume:  Normal  Mood:  Euthymic  Affect:  Appropriate  Thought Process:  Goal Directed and Descriptions of Associations: Intact  Orientation:  Full (Time, Place, and Person)  Thought Content:  Logical  Suicidal Thoughts:  No  Homicidal Thoughts:  No  Memory:  Immediate;   Fair Recent;   Fair Remote;   Fair  Judgement:  Fair  Insight:  Fair  Psychomotor Activity:  Normal  Concentration:  Fair  Recall:  FiservFair  Fund of Knowledge:Fair  Language: Fair  Akathisia:  No  Handed:  Right  AIMS (if indicated):     Assets:  Desire for Improvement  Sleep:  Number of Hours: 5.75  Cognition: WNL  ADL's:  Intact   Mental Status Per Nursing Assessment::   On Admission:   (Patient refused to answer questions on admission)  Demographic Factors:  Caucasian  Loss Factors: NA  Historical Factors: Impulsivity  Risk Reduction Factors:    Positive therapeutic relationship  Continued Clinical Symptoms:  Alcohol/Substance Abuse/Dependencies Previous Psychiatric Diagnoses and Treatments  Cognitive Features That Contribute To Risk:  None    Suicide Risk:  Minimal: No identifiable suicidal ideation.  Patients presenting with no risk factors but with morbid ruminations; may be classified as minimal risk based on the severity of the depressive symptoms  Follow-up Information    [facility] Follow up on 01/09/2016.   Why:  Thursday at 10:45 for an 11:00 meeting with Amanda SpareSteven [facility].  Please bring insurance information.  They also have a psychiatrist who can see you for your medication as well. Contact information: [address]  [place] [id number]]           Plan Of Care/Follow-up recommendations:  Activity:  no restrictions Diet:  regular Other:  as needed  Amanda Melander, MD 01/08/2016, 8:46 AM

## 2016-01-08 NOTE — Tx Team (Signed)
Interdisciplinary Treatment and Diagnostic Plan Update  01/08/2016 Time of Session: 10:27 AM  Amanda Wagner MRN: [id number]  Principal Diagnosis: Severe episode of recurrent major depressive disorder, without psychotic features ([place])  Secondary Diagnoses: Principal Problem:   Severe episode of recurrent major depressive disorder, without psychotic features ([place]) Active Problems:   Alcohol use disorder, moderate, dependence ([place])   History of ADHD   Current Medications:  Current Facility-Administered Medications  Medication Dose Route Frequency Provider Last Rate Last Dose  . acamprosate (CAMPRAL) tablet 666 mg  666 mg Oral TID WC Ursula Alert, MD   666 mg at 01/08/16 0917  . acetaminophen (TYLENOL) tablet 650 mg  650 mg Oral Q6H PRN Ethelene Hal, NP   650 mg at 01/04/16 2059  . alum & mag hydroxide-simeth (MAALOX/MYLANTA) 200-200-20 MG/5ML suspension 30 mL  30 mL Oral Q4H PRN Ethelene Hal, NP      . citalopram (CELEXA) tablet 10 mg  10 mg Oral Daily Ursula Alert, MD   10 mg at 01/08/16 0801  . magnesium hydroxide (MILK OF MAGNESIA) suspension 30 mL  30 mL Oral Daily PRN Ethelene Hal, NP      . multivitamin with minerals tablet 1 tablet  1 tablet Oral Daily Derrill Center, NP   1 tablet at 01/08/16 0801  . nicotine polacrilex (NICORETTE) gum 2 mg  2 mg Oral PRN Jenne Campus, MD   2 mg at 01/08/16 0802  . thiamine (VITAMIN B-1) tablet 100 mg  100 mg Oral Daily Derrill Center, NP   100 mg at 01/08/16 0801  . traZODone (DESYREL) tablet 100 mg  100 mg Oral QHS PRN Ursula Alert, MD        PTA Medications: Prescriptions Prior to Admission  Medication Sig Dispense Refill Last Dose  . ALPRAZolam (XANAX) 0.5 MG tablet Take 0.5 mg by mouth 2 (two) times daily.   01/03/2016 at Unknown time  . amphetamine-dextroamphetamine (ADDERALL) 10 MG tablet Take 30 mg by mouth daily with breakfast.   01/03/2016 at Unknown time    Treatment Modalities: Medication  Management, Group therapy, Case management,  1 to 1 session with clinician, Psychoeducation, Recreational therapy.   Physician Treatment Plan for Primary Diagnosis: Severe episode of recurrent major depressive disorder, without psychotic features ([place]) Long Term Goal(s): Improvement in symptoms so as ready for discharge  Short Term Goals: Ability to identify changes in lifestyle to reduce recurrence of condition will improve Ability to demonstrate self-control will improve Ability to maintain clinical measurements within normal limits will improve Ability to identify changes in lifestyle to reduce recurrence of condition will improve Ability to disclose and discuss suicidal ideas Compliance with prescribed medications will improve Ability to identify triggers associated with substance abuse/mental health issues will improve  Medication Management: Evaluate patient's response, side effects, and tolerance of medication regimen.  Therapeutic Interventions: 1 to 1 sessions, Unit Group sessions and Medication administration.  Evaluation of Outcomes: Adequate for Discharge  Physician Treatment Plan for Secondary Diagnosis: Principal Problem:   Severe episode of recurrent major depressive disorder, without psychotic features ([place]) Active Problems:   Alcohol use disorder, moderate, dependence ([place])   History of ADHD   Long Term Goal(s): Improvement in symptoms so as ready for discharge  Short Term Goals: Ability to identify changes in lifestyle to reduce recurrence of condition will improve Ability to demonstrate self-control will improve Ability to maintain clinical measurements within normal limits will improve Ability to identify changes in lifestyle to reduce  recurrence of condition will improve Ability to disclose and discuss suicidal ideas Compliance with prescribed medications will improve Ability to identify triggers associated with substance abuse/mental health issues will  improve  Medication Management: Evaluate patient's response, side effects, and tolerance of medication regimen.  Therapeutic Interventions: 1 to 1 sessions, Unit Group sessions and Medication administration.  Evaluation of Outcomes: Adequate for Discharge   RN Treatment Plan for Primary Diagnosis: Severe episode of recurrent major depressive disorder, without psychotic features ([place]) Long Term Goal(s): Knowledge of disease and therapeutic regimen to maintain health will improve  Short Term Goals: Ability to identify and develop effective coping behaviors will improve and Compliance with prescribed medications will improve  Medication Management: RN will administer medications as ordered by provider, will assess and evaluate patient's response and provide education to patient for prescribed medication. RN will report any adverse and/or side effects to prescribing provider.  Therapeutic Interventions: 1 on 1 counseling sessions, Psychoeducation, Medication administration, Evaluate responses to treatment, Monitor vital signs and CBGs as ordered, Perform/monitor CIWA, COWS, AIMS and Fall Risk screenings as ordered, Perform wound care treatments as ordered.  Evaluation of Outcomes: Adequate for Discharge   Recreational Therapy Treatment Plan for Primary Diagnosis: Severe episode of recurrent major depressive disorder, without psychotic features ([place]) Long Term Goal(s): LTG- Patient will participate in recreation therapy tx in at least 2 group sessions without prompting from LRT.  Short Term Goals:  Patient will be able to identify at least 5 coping skills for admitting dx by conclusion of recreation therapy tx.  Treatment Modalities: Group and Pet Therapy  Therapeutic Interventions: Psychoeducation  Evaluation of Outcomes: Met   LCSW Treatment Plan for Primary Diagnosis: Severe episode of recurrent major depressive disorder, without psychotic features ([place]) Long Term Goal(s): Safe  transition to appropriate next level of care at discharge, Engage patient in therapeutic group addressing interpersonal concerns.  Short Term Goals: Engage patient in aftercare planning with referrals and resources  Therapeutic Interventions: Assess for all discharge needs, 1 to 1 time with Social worker, Explore available resources and support systems, Assess for adequacy in community support network, Educate family and significant other(s) on suicide prevention, Complete Psychosocial Assessment, Interpersonal group therapy.  Evaluation of Outcomes: Met  Plans to return home, follow up outpt   Progress in Treatment: Attending groups: Yes Participating in groups: Yes Taking medication as prescribed: Yes Toleration medication: Yes, no side effects reported at this time Family/Significant other contact made: No Patient understands diagnosis: Yes AEB asking for help with mood, substance abuse Discussing patient identified problems/goals with staff: Yes Medical problems stabilized or resolved: Yes Denies suicidal/homicidal ideation: Yes Issues/concerns per patient self-inventory: None Other: N/A  New problem(s) identified: None identified at this time.   New Short Term/Long Term Goal(s): None identified at this time.   Discharge Plan or Barriers:   Reason for Continuation of Hospitalization: Medication stabilization   Estimated Length of Stay: D/C today  Attendees: Patient: 01/08/2016  10:27 AM  Physician: Ursula Alert, MD 01/08/2016  10:27 AM  Nursing: Hoy Register, RN 01/08/2016  10:27 AM  RN Care Manager: Lars Pinks, RN 01/08/2016  10:27 AM  Social Worker: Ripley Fraise 01/08/2016  10:27 AM  Recreational Therapist: Laretta Bolster  01/08/2016  10:27 AM  Other: Norberto Sorenson 01/08/2016  10:27 AM  Other:  01/08/2016  10:27 AM    Scribe for Treatment Team:  Roque Lias LCSW 01/08/2016 10:27 AM

## 2016-01-08 NOTE — Progress Notes (Signed)
  [facility] Dis[facility] :  Will you be returning to the same living situation after discharge:  Yes,  home At discharge, do you have transportation home?: Yes,  friend Do you have the ability to pay for your medications: Yes,  insurance  Release of information consent forms completed and in the chart;  Patient's signature needed at discharge.  Patient to Follow up at: Follow-up Information    [facility] Follow up on 01/09/2016.   Why:  Thursday at 10:45 for an 11:00 meeting with Viviann SpareSteven [facility].  Please bring insurance information.  They also have a psychiatrist who can see you for your medication as well. Contact information: [address]  [place] [[id number]]           Next level of care provider has access to [facility] Link:no  Safety Planning and Suicide Prevention discussed: Yes,  yes  Have you used any form of tobacco in the last 30 days? (Cigarettes, Smokeless Tobacco, Cigars, and/or Pipes): Patient Refused Screening  Has patient been referred to the Quitline?: Patient refused referral  Patient has been referred for addiction treatment: Yes  Amanda Wagner 01/08/2016, 10:28 AM

## 2017-08-18 ENCOUNTER — Ambulatory Visit (INDEPENDENT_AMBULATORY_CARE_PROVIDER_SITE_OTHER): Payer: Medicaid Other

## 2017-08-18 DIAGNOSIS — Z3201 Encounter for pregnancy test, result positive: Secondary | ICD-10-CM

## 2017-08-18 DIAGNOSIS — N912 Amenorrhea, unspecified: Secondary | ICD-10-CM

## 2017-08-18 LAB — POCT URINE PREGNANCY: Preg Test, Ur: POSITIVE — AB

## 2017-08-18 NOTE — Progress Notes (Signed)
[name] presents today for UPT. She has no unusual complaints. LMP: 06/30/17    Home UPT Result: Positive   In-Office UPT result: Positive  I have reviewed the patient's medical, obstetrical, social, and family histories, and medications.   ASSESSMENT: Positive pregnancy test  PLAN Prenatal care to be completed at:  [facility]

## 2017-09-14 ENCOUNTER — Encounter: Payer: Self-pay | Admitting: Obstetrics and Gynecology

## 2017-09-14 ENCOUNTER — Ambulatory Visit (INDEPENDENT_AMBULATORY_CARE_PROVIDER_SITE_OTHER): Payer: Medicaid Other | Admitting: Obstetrics and Gynecology

## 2017-09-14 ENCOUNTER — Other Ambulatory Visit (HOSPITAL_COMMUNITY)
Admission: RE | Admit: 2017-09-14 | Discharge: 2017-09-14 | Disposition: A | Discharge status: Home / Self Care | Payer: Medicaid Other | Source: Ambulatory Visit | Attending: Obstetrics and Gynecology | Admitting: Obstetrics and Gynecology

## 2017-09-14 VITALS — BP 93/53 | HR 96 | Ht 68.0 in | Wt 185.0 lb

## 2017-09-14 DIAGNOSIS — Z3A10 10 weeks gestation of pregnancy: Secondary | ICD-10-CM | POA: Diagnosis not present

## 2017-09-14 DIAGNOSIS — O0991 Supervision of high risk pregnancy, unspecified, first trimester: Secondary | ICD-10-CM | POA: Insufficient documentation

## 2017-09-14 DIAGNOSIS — Z9889 Other specified postprocedural states: Secondary | ICD-10-CM

## 2017-09-14 DIAGNOSIS — F332 Major depressive disorder, recurrent severe without psychotic features: Secondary | ICD-10-CM

## 2017-09-14 DIAGNOSIS — Z9151 Personal history of suicidal behavior: Secondary | ICD-10-CM | POA: Insufficient documentation

## 2017-09-14 DIAGNOSIS — O0990 Supervision of high risk pregnancy, unspecified, unspecified trimester: Secondary | ICD-10-CM

## 2017-09-14 DIAGNOSIS — Z915 Personal history of self-harm: Secondary | ICD-10-CM

## 2017-09-14 DIAGNOSIS — Z8659 Personal history of other mental and behavioral disorders: Secondary | ICD-10-CM

## 2017-09-14 MED ORDER — PREPLUS 27-1 MG PO TABS: 1.0000 | ORAL_TABLET | Freq: Every day | ORAL | 13 refills | Status: AC

## 2017-09-14 NOTE — Progress Notes (Signed)
Patient has been on adderall but stopped when she found out she was pregnant. Patient states she feels she needs it though- wondering if she can take a smaller dose. Armandina StammerJennifer Howard RN

## 2017-09-14 NOTE — Progress Notes (Signed)
Subjective:  Amanda Wagner is a 32 y.o. G2P1001 at 1177w6d being seen today for first OB visit. EDD by certain LMP. H/O TSVD in 2011 without problems. Chronic medical problems and medications as listed. Has been off antidepressant medications for over a year. Stopped Adderrall with knowledge of pregnancy. Thinks she needs to restart. PCP was managing all medications. H/O ETOH abuse but has been sober x 1 yr.   She is currently monitored for the following issues for this high-risk pregnancy and has Severe episode of recurrent major depressive disorder, without psychotic features (HCC); Alcohol use disorder, moderate, dependence (HCC); History of ADHD; History of attempted suicide; Supervision of high risk pregnancy, antepartum; and History of cardiac radiofrequency ablation on their problem list.  Patient reports no complaints.   . Vag. Bleeding: None.   . Denies leaking of fluid.   The following portions of the patient's history were reviewed and updated as appropriate: allergies, current medications, past family history, past medical history, past social history, past surgical history and problem list. Problem list updated.  Objective:   Vitals:   09/14/17 1350 09/14/17 1352  BP: (!) 93/53   Pulse: 96   Weight: 185 lb (83.9 kg)   Height:  5\' 8"  (1.727 m)    Fetal Status: Fetal Heart Rate (bpm): 165         General:  Alert, oriented and cooperative. Patient is in no acute distress.  Skin: Skin is warm and dry. No rash noted.   Cardiovascular: Normal heart rate noted  Respiratory: Normal respiratory effort, no problems with respiration noted  Abdomen: Soft, gravid, appropriate for gestational age. Pain/Pressure: Absent     Pelvic:  Cervical exam performed        Extremities: Normal range of motion.  Edema: None  Mental Status: Normal mood and affect. Normal behavior. Normal judgment and thought content.   Urinalysis:      Assessment and Plan:  Pregnancy: G2P1001 at 2777w6d  1. History of  attempted suicide Stable Information on [facility] provided to pt  2. Supervision of high risk pregnancy, antepartum Prenatal labs abd care reviewed with pt - Obstetric Panel, Including HIV - Culture, OB Urine - Cystic Fibrosis Mutation 97 - Cytology - PAP - Prenatal Vit-Fe Fumarate-FA (PREPLUS) 27-1 MG TABS; Take 1 tablet by mouth daily.  Dispense: 30 tablet; Refill: 13 - ToxASSURE Select 13 (MW), Urine - Genetic Screening  3. Severe episode of recurrent major depressive disorder, without psychotic features (HCC) To contact PCP to see if will management o/w pt to see [facility] for eval and management   4. History of ADHD See above  5. History of cardiac radiofrequency ablation Stable   Preterm labor symptoms and general obstetric precautions including but not limited to vaginal bleeding, contractions, leaking of fluid and fetal movement were reviewed in detail with the patient. Please refer to After Visit Summary for other counseling recommendations.  Return in about 4 weeks (around 10/12/2017) for OB visit.   Hermina StaggersErvin, Mitesh Rosendahl L, MD

## 2017-09-14 NOTE — Patient Instructions (Signed)
First Trimester of Pregnancy The first trimester of pregnancy is from week 1 until the end of week 13 (months 1 through 3). A week after a sperm fertilizes an egg, the egg will implant on the wall of the uterus. This embryo will begin to develop into a baby. Genes from you and your partner will form the baby. The female genes will determine whether the baby will be a boy or a girl. At 6-8 weeks, the eyes and face will be formed, and the heartbeat can be seen on ultrasound. At the end of 12 weeks, all the baby's organs will be formed. Now that you are pregnant, you will want to do everything you can to have a healthy baby. Two of the most important things are to get good prenatal care and to follow your health care provider's instructions. Prenatal care is all the medical care you receive before the baby's birth. This care will help prevent, find, and treat any problems during the pregnancy and childbirth. Body changes during your first trimester Your body goes through many changes during pregnancy. The changes vary from woman to woman.  You may gain or lose a couple of pounds at first.  You may feel sick to your stomach (nauseous) and you may throw up (vomit). If the vomiting is uncontrollable, call your health care provider.  You may tire easily.  You may develop headaches that can be relieved by medicines. All medicines should be approved by your health care provider.  You may urinate more often. Painful urination may mean you have a bladder infection.  You may develop heartburn as a result of your pregnancy.  You may develop constipation because certain hormones are causing the muscles that push stool through your intestines to slow down.  You may develop hemorrhoids or swollen veins (varicose veins).  Your breasts may begin to grow larger and become tender. Your nipples may stick out more, and the tissue that surrounds them (areola) may become darker.  Your gums may bleed and may be  sensitive to brushing and flossing.  Dark spots or blotches (chloasma, mask of pregnancy) may develop on your face. This will likely fade after the baby is born.  Your menstrual periods will stop.  You may have a loss of appetite.  You may develop cravings for certain kinds of food.  You may have changes in your emotions from day to day, such as being excited to be pregnant or being concerned that something may go wrong with the pregnancy and baby.  You may have more vivid and strange dreams.  You may have changes in your hair. These can include thickening of your hair, rapid growth, and changes in texture. Some women also have hair loss during or after pregnancy, or hair that feels dry or thin. Your hair will most likely return to normal after your baby is born.  What to expect at prenatal visits During a routine prenatal visit:  You will be weighed to make sure you and the baby are growing normally.  Your blood pressure will be taken.  Your abdomen will be measured to track your baby's growth.  The fetal heartbeat will be listened to between weeks 10 and 14 of your pregnancy.  Test results from any previous visits will be discussed.  Your health care provider may ask you:  How you are feeling.  If you are feeling the baby move.  If you have had any abnormal symptoms, such as leaking fluid, bleeding, severe headaches,   or abdominal cramping.  If you are using any tobacco products, including cigarettes, chewing tobacco, and electronic cigarettes.  If you have any questions.  Other tests that may be performed during your first trimester include:  Blood tests to find your blood type and to check for the presence of any previous infections. The tests will also be used to check for low iron levels (anemia) and protein on red blood cells (Rh antibodies). Depending on your risk factors, or if you previously had diabetes during pregnancy, you may have tests to check for high blood  sugar that affects pregnant women (gestational diabetes).  Urine tests to check for infections, diabetes, or protein in the urine.  An ultrasound to confirm the proper growth and development of the baby.  Fetal screens for spinal cord problems (spina bifida) and Down syndrome.  HIV (human immunodeficiency virus) testing. Routine prenatal testing includes screening for HIV, unless you choose not to have this test.  You may need other tests to make sure you and the baby are doing well.  Follow these instructions at home: Medicines  Follow your health care provider's instructions regarding medicine use. Specific medicines may be either safe or unsafe to take during pregnancy.  Take a prenatal vitamin that contains at least 600 micrograms (mcg) of folic acid.  If you develop constipation, try taking a stool softener if your health care provider approves. Eating and drinking  Eat a balanced diet that includes fresh fruits and vegetables, whole grains, good sources of protein such as meat, eggs, or tofu, and low-fat dairy. Your health care provider will help you determine the amount of weight gain that is right for you.  Avoid raw meat and uncooked cheese. These carry germs that can cause birth defects in the baby.  Eating four or five small meals rather than three large meals a day may help relieve nausea and vomiting. If you start to feel nauseous, eating a few soda crackers can be helpful. Drinking liquids between meals, instead of during meals, also seems to help ease nausea and vomiting.  Limit foods that are high in fat and processed sugars, such as fried and sweet foods.  To prevent constipation: ? Eat foods that are high in fiber, such as fresh fruits and vegetables, whole grains, and beans. ? Drink enough fluid to keep your urine clear or pale yellow. Activity  Exercise only as directed by your health care provider. Most women can continue their usual exercise routine during  pregnancy. Try to exercise for 30 minutes at least 5 days a week. Exercising will help you: ? Control your weight. ? Stay in shape. ? Be prepared for labor and delivery.  Experiencing pain or cramping in the lower abdomen or lower back is a good sign that you should stop exercising. Check with your health care provider before continuing with normal exercises.  Try to avoid standing for long periods of time. Move your legs often if you must stand in one place for a long time.  Avoid heavy lifting.  Wear low-heeled shoes and practice good posture.  You may continue to have sex unless your health care provider tells you not to. Relieving pain and discomfort  Wear a good support bra to relieve breast tenderness.  Take warm sitz baths to soothe any pain or discomfort caused by hemorrhoids. Use hemorrhoid cream if your health care provider approves.  Rest with your legs elevated if you have leg cramps or low back pain.  If you develop   varicose veins in your legs, wear support hose. Elevate your feet for 15 minutes, 3-4 times a day. Limit salt in your diet. Prenatal care  Schedule your prenatal visits by the twelfth week of pregnancy. They are usually scheduled monthly at first, then more often in the last 2 months before delivery.  Write down your questions. Take them to your prenatal visits.  Keep all your prenatal visits as told by your health care provider. This is important. Safety  Wear your seat belt at all times when driving.  Make a list of emergency phone numbers, including numbers for family, friends, the hospital, and police and fire departments. General instructions  Ask your health care provider for a referral to a local prenatal education class. Begin classes no later than the beginning of month 6 of your pregnancy.  Ask for help if you have counseling or nutritional needs during pregnancy. Your health care provider can offer advice or refer you to specialists for help  with various needs.  Do not use hot tubs, steam rooms, or saunas.  Do not douche or use tampons or scented sanitary pads.  Do not cross your legs for long periods of time.  Avoid cat litter boxes and soil used by cats. These carry germs that can cause birth defects in the baby and possibly loss of the fetus by miscarriage or stillbirth.  Avoid all smoking, herbs, alcohol, and medicines not prescribed by your health care provider. Chemicals in these products affect the formation and growth of the baby.  Do not use any products that contain nicotine or tobacco, such as cigarettes and e-cigarettes. If you need help quitting, ask your health care provider. You may receive counseling support and other resources to help you quit.  Schedule a dentist appointment. At home, brush your teeth with a soft toothbrush and be gentle when you floss. Contact a health care provider if:  You have dizziness.  You have mild pelvic cramps, pelvic pressure, or nagging pain in the abdominal area.  You have persistent nausea, vomiting, or diarrhea.  You have a bad smelling vaginal discharge.  You have pain when you urinate.  You notice increased swelling in your face, hands, legs, or ankles.  You are exposed to fifth disease or chickenpox.  You are exposed to German measles (rubella) and have never had it. Get help right away if:  You have a fever.  You are leaking fluid from your vagina.  You have spotting or bleeding from your vagina.  You have severe abdominal cramping or pain.  You have rapid weight gain or loss.  You vomit blood or material that looks like coffee grounds.  You develop a severe headache.  You have shortness of breath.  You have any kind of trauma, such as from a fall or a car accident. Summary  The first trimester of pregnancy is from week 1 until the end of week 13 (months 1 through 3).  Your body goes through many changes during pregnancy. The changes vary from  woman to woman.  You will have routine prenatal visits. During those visits, your health care provider will examine you, discuss any test results you may have, and talk with you about how you are feeling. This information is not intended to replace advice given to you by your health care provider. Make sure you discuss any questions you have with your health care provider. Document Released: 01/06/2001 Document Revised: 12/25/2015 Document Reviewed: 12/25/2015 Elsevier Interactive Patient Education  2018 [employer]   Inc[employer]  

## 2017-09-17 LAB — CULTURE, OB URINE

## 2017-09-17 LAB — URINE CULTURE, OB REFLEX

## 2017-09-17 LAB — CYTOLOGY - PAP
Chlamydia: NEGATIVE
Diagnosis: NEGATIVE
HPV (WINDOPATH): DETECTED — AB
NEISSERIA GONORRHEA: NEGATIVE

## 2017-09-18 LAB — TOXASSURE SELECT 13 (MW), URINE

## 2017-09-21 LAB — OBSTETRIC PANEL, INCLUDING HIV
ANTIBODY SCREEN: NEGATIVE
BASOS: 0 %
Basophils Absolute: 0 10*3/uL (ref 0.0–0.2)
EOS (ABSOLUTE): 0.1 10*3/uL (ref 0.0–0.4)
EOS: 1 %
HEMATOCRIT: 33.1 % — AB (ref 34.0–46.6)
HEP B S AG: NEGATIVE
HIV SCREEN 4TH GENERATION: NONREACTIVE
Hemoglobin: 11.7 g/dL (ref 11.1–15.9)
Immature Grans (Abs): 0 10*3/uL (ref 0.0–0.1)
Immature Granulocytes: 0 %
LYMPHS ABS: 1.9 10*3/uL (ref 0.7–3.1)
Lymphs: 20 %
MCH: 31.4 pg (ref 26.6–33.0)
MCHC: 35.3 g/dL (ref 31.5–35.7)
MCV: 89 fL (ref 79–97)
MONOCYTES: 7 %
Monocytes Absolute: 0.7 10*3/uL (ref 0.1–0.9)
Neutrophils Absolute: 6.9 10*3/uL (ref 1.4–7.0)
Neutrophils: 72 %
PLATELETS: 271 10*3/uL (ref 150–450)
RBC: 3.73 x10E6/uL — ABNORMAL LOW (ref 3.77–5.28)
RDW: 13.6 % (ref 12.3–15.4)
RH TYPE: POSITIVE
RPR: NONREACTIVE
WBC: 9.7 10*3/uL (ref 3.4–10.8)

## 2017-09-21 LAB — CYSTIC FIBROSIS MUTATION 97: Interpretation: NOT DETECTED

## 2017-09-22 ENCOUNTER — Telehealth: Payer: Self-pay

## 2017-09-22 ENCOUNTER — Other Ambulatory Visit: Payer: Self-pay

## 2017-09-22 DIAGNOSIS — O2340 Unspecified infection of urinary tract in pregnancy, unspecified trimester: Secondary | ICD-10-CM

## 2017-09-22 MED ORDER — NITROFURANTOIN MONOHYD MACRO 100 MG PO CAPS: 100.0000 mg | ORAL_CAPSULE | Freq: Two times a day (BID) | ORAL | 0 refills | Status: AC

## 2017-09-22 NOTE — Telephone Encounter (Signed)
Pt calling regarding results From last week Urine culture Pt notified Rx was sent .  Pt voiced understanding.

## 2017-10-12 ENCOUNTER — Encounter: Payer: Self-pay | Admitting: Obstetrics and Gynecology

## 2017-10-12 ENCOUNTER — Ambulatory Visit (INDEPENDENT_AMBULATORY_CARE_PROVIDER_SITE_OTHER): Payer: Medicaid Other | Admitting: Obstetrics and Gynecology

## 2017-10-12 VITALS — BP 109/76 | HR 94 | Wt 190.7 lb

## 2017-10-12 DIAGNOSIS — Z915 Personal history of self-harm: Secondary | ICD-10-CM

## 2017-10-12 DIAGNOSIS — F1020 Alcohol dependence, uncomplicated: Secondary | ICD-10-CM

## 2017-10-12 DIAGNOSIS — Z9151 Personal history of suicidal behavior: Secondary | ICD-10-CM

## 2017-10-12 DIAGNOSIS — R300 Dysuria: Secondary | ICD-10-CM

## 2017-10-12 DIAGNOSIS — Z9889 Other specified postprocedural states: Secondary | ICD-10-CM

## 2017-10-12 DIAGNOSIS — F332 Major depressive disorder, recurrent severe without psychotic features: Secondary | ICD-10-CM

## 2017-10-12 DIAGNOSIS — O0990 Supervision of high risk pregnancy, unspecified, unspecified trimester: Secondary | ICD-10-CM

## 2017-10-12 NOTE — Progress Notes (Signed)
Subjective:  Amanda Wagner is a 32 y.o. G2P1001 at 3927w6d being seen today for ongoing prenatal care.  She is currently monitored for the following issues for this high-risk pregnancy and has Severe episode of recurrent major depressive disorder, without psychotic features (HCC); Alcohol use disorder, moderate, dependence (HCC); History of ADHD; History of attempted suicide; Supervision of high risk pregnancy, antepartum; and History of cardiac radiofrequency ablation on their problem list.  Patient reports some dysuria and urine odor. Tx for UTI in August.  Contractions: Not present. Vag. Bleeding: None.   . Denies leaking of fluid.   The following portions of the patient's history were reviewed and updated as appropriate: allergies, current medications, past family history, past medical history, past social history, past surgical history and problem list. Problem list updated.  Objective:   Vitals:   10/12/17 1137  BP: 109/76  Pulse: 94  Weight: 190 lb 11.2 oz (86.5 kg)    Fetal Status: Fetal Heart Rate (bpm): 152         General:  Alert, oriented and cooperative. Patient is in no acute distress.  Skin: Skin is warm and dry. No rash noted.   Cardiovascular: Normal heart rate noted  Respiratory: Normal respiratory effort, no problems with respiration noted  Abdomen: Soft, gravid, appropriate for gestational age. Pain/Pressure: Absent     Pelvic:  Cervical exam deferred        Extremities: Normal range of motion.  Edema: None  Mental Status: Normal mood and affect. Normal behavior. Normal judgment and thought content.   Urinalysis:      Assessment and Plan:  Pregnancy: G2P1001 at 6027w6d  1. Supervision of high risk pregnancy, antepartum Stable Anatomy scan ordered  2. Dysuria - POCT Urinalysis Dipstick - Urine Culture  3. Severe episode of recurrent major depressive disorder, without psychotic features (HCC) Off meds and reports doing well. Did not follow up with PCP or  [facility] regarding options in [place] if pt desires  4. History of cardiac radiofrequency ablation Stable  5. History of attempted suicide See above  6. Alcohol use disorder, moderate, dependence (HCC) Denies use presently See above  Preterm labor symptoms and general obstetric precautions including but not limited to vaginal bleeding, contractions, leaking of fluid and fetal movement were reviewed in detail with the patient. Please refer to After Visit Summary for other counseling recommendations.  Return in about 4 weeks (around 11/09/2017) for OB visit.   Hermina StaggersErvin, Kaci Freel L, MD

## 2017-10-12 NOTE — Patient Instructions (Signed)

## 2017-10-12 NOTE — Progress Notes (Signed)
Pt is here for Rob. 4523w6d. Pt states she is still experiencing dysuria. Pt had UTI on 09/14/17- finished macrobid.

## 2017-10-14 LAB — URINE CULTURE

## 2017-11-09 ENCOUNTER — Encounter: Payer: Self-pay | Admitting: Obstetrics and Gynecology

## 2017-11-09 ENCOUNTER — Ambulatory Visit (INDEPENDENT_AMBULATORY_CARE_PROVIDER_SITE_OTHER): Payer: Medicaid Other | Admitting: Obstetrics and Gynecology

## 2017-11-09 DIAGNOSIS — O0990 Supervision of high risk pregnancy, unspecified, unspecified trimester: Secondary | ICD-10-CM

## 2017-11-09 DIAGNOSIS — Z23 Encounter for immunization: Secondary | ICD-10-CM | POA: Diagnosis not present

## 2017-11-09 DIAGNOSIS — O0992 Supervision of high risk pregnancy, unspecified, second trimester: Secondary | ICD-10-CM

## 2017-11-09 NOTE — Progress Notes (Signed)
   PRENATAL VISIT NOTE  Subjective:  Amanda Wagner is a 32 y.o. G2P1001 at [redacted]w[redacted]d being seen today for ongoing prenatal care.  She is currently monitored for the following issues for this low-risk pregnancy and has Severe episode of recurrent major depressive disorder, without psychotic features (HCC); Alcohol use disorder, moderate, dependence (HCC); History of ADHD; History of attempted suicide; Supervision of high risk pregnancy, antepartum; and History of cardiac radiofrequency ablation on their problem list.  Patient reports strong odor in urine. Improved from when she had UTI, not drinking much fluids.  Contractions: Not present. Vag. Bleeding: None.  Movement: Present. Denies leaking of fluid.   The following portions of the patient's history were reviewed and updated as appropriate: allergies, current medications, past family history, past medical history, past social history, past surgical history and problem list. Problem list updated.  Objective:   Vitals:   11/09/17 0834  BP: 103/67  Pulse: 97  Weight: 201 lb (91.2 kg)    Fetal Status: Fetal Heart Rate (bpm): 140   Movement: Present     General:  Alert, oriented and cooperative. Patient is in no acute distress.  Skin: Skin is warm and dry. No rash noted.   Cardiovascular: Normal heart rate noted  Respiratory: Normal respiratory effort, no problems with respiration noted  Abdomen: Soft, gravid, appropriate for gestational age.  Pain/Pressure: Absent     Pelvic: Cervical exam deferred        Extremities: Normal range of motion.     Mental Status: Normal mood and affect. Normal behavior. Normal judgment and thought content.   Assessment and Plan:  Pregnancy: G2P1001 at [redacted]w[redacted]d  1. Supervision of high risk pregnancy, antepartum Anatomy US scheduled today Counseled regarding risks/benefits of flu vaccine, patient accepts vaccine. Undecided regarding contraception  Encouraged her to drink more fluids, will recheck urine next  visit if still has dark color  2. H/o depression/anxiety Not on any meds, feels stable Offered BH, declines today  Preterm labor symptoms and general obstetric precautions including but not limited to vaginal bleeding, contractions, leaking of fluid and fetal movement were reviewed in detail with the patient. Please refer to After Visit Summary for other counseling recommendations.  Return in about 4 weeks (around 12/07/2017) for OB visit.  Future Appointments  Date Time Provider Department Center  11/16/2017 12:45 PM [facility] [place] [facility]  12/07/2017  1:15 PM Constant, Gigi Gin, MD [facility] None    Conan Bowens, MD

## 2017-11-09 NOTE — Patient Instructions (Signed)
Contraception Choices Contraception, also called birth control, refers to methods or devices that prevent pregnancy. Hormonal methods Contraceptive implant A contraceptive implant is a thin, plastic tube that contains a hormone. It is inserted into the upper part of the arm. It can remain in place for up to 3 years. Progestin-only injections Progestin-only injections are injections of progestin, a synthetic form of the hormone progesterone. They are given every 3 months by a health care provider. Birth control pills Birth control pills are pills that contain hormones that prevent pregnancy. They must be taken once a day, preferably at the same time each day. Birth control patch The birth control patch contains hormones that prevent pregnancy. It is placed on the skin and must be changed once a week for three weeks and removed on the fourth week. A prescription is needed to use this method of contraception. Vaginal ring A vaginal ring contains hormones that prevent pregnancy. It is placed in the vagina for three weeks and removed on the fourth week. After that, the process is repeated with a new ring. A prescription is needed to use this method of contraception. Emergency contraceptive Emergency contraceptives prevent pregnancy after unprotected sex. They come in pill form and can be taken up to 5 days after sex. They work best the sooner they are taken after having sex. Most emergency contraceptives are available without a prescription. This method should not be used as your only form of birth control. Barrier methods Female condom A female condom is a thin sheath that is worn over the penis during sex. Condoms keep sperm from going inside a woman's body. They can be used with a spermicide to increase their effectiveness. They should be disposed after a single use. Female condom A female condom is a soft, loose-fitting sheath that is put into the vagina before sex. The condom keeps sperm from going  inside a woman's body. They should be disposed after a single use.  Intrauterine contraception Intrauterine device (IUD) An IUD is a T-shaped device that is put in a woman's uterus. There are two types:  Hormone IUD.This type contains progestin, a synthetic form of the hormone progesterone. This type can stay in place for 3-5 years.  Copper IUD.This type is wrapped in copper wire. It can stay in place for 10 years.  Permanent methods of contraception Female tubal ligation In this method, a woman's fallopian tubes are sealed, tied, or blocked during surgery to prevent eggs from traveling to the uterus.  Female sterilization This is a procedure to tie off the tubes that carry sperm (vasectomy). After the procedure, the man can still ejaculate fluid (semen).  Summary  Contraception, also called birth control, means methods or devices that prevent pregnancy.  Hormonal methods of contraception include implants, injections, pills, patches, vaginal rings, and emergency contraceptives.  Barrier methods of contraception can include female condoms, female condoms, diaphragms, cervical caps, sponges, and spermicides.  There are two types of IUDs (intrauterine devices). An IUD can be put in a woman's uterus to prevent pregnancy for 3-5 years.  Permanent sterilization can be done through a procedure for males, females, or both. This information is not intended to replace advice given to you by your health care provider. Make sure you discuss any questions you have with your health care provider. Document Released: 01/12/2005 Document Revised: 02/15/2016 Document Reviewed: 02/15/2016 Elsevier Interactive Patient Education  2018 Elsevier Inc.  Etonogestrel implant What is this medicine? ETONOGESTREL (et oh noe JES trel) is a contraceptive (birth   control) device. It is used to prevent pregnancy. It can be used for up to 3 years. This medicine may be used for other purposes; ask your health care  provider or pharmacist if you have questions. COMMON BRAND NAME(S): Implanon, Nexplanon What should I tell my health care provider before I take this medicine? They need to know if you have any of these conditions: -abnormal vaginal bleeding -blood vessel disease or blood clots -cancer of the breast, cervix, or liver -depression -diabetes -gallbladder disease -headaches -heart disease or recent heart attack -high blood pressure -high cholesterol -kidney disease -liver disease -renal disease -seizures -tobacco smoker -an unusual or allergic reaction to etonogestrel, other hormones, anesthetics or antiseptics, medicines, foods, dyes, or preservatives -pregnant or trying to get pregnant -breast-feeding How should I use this medicine? This device is inserted just under the skin on the inner side of your upper arm by a health care professional. Talk to your pediatrician regarding the use of this medicine in children. Special care may be needed. Overdosage: If you think you have taken too much of this medicine contact a poison control center or emergency room at once. NOTE: This medicine is only for you. Do not share this medicine with others. What if I miss a dose? This does not apply. What may interact with this medicine? Do not take this medicine with any of the following medications: -amprenavir -bosentan -fosamprenavir This medicine may also interact with the following medications: -barbiturate medicines for inducing sleep or treating seizures -certain medicines for fungal infections like ketoconazole and itraconazole -grapefruit juice -griseofulvin -medicines to treat seizures like carbamazepine, felbamate, oxcarbazepine, phenytoin, topiramate -modafinil -phenylbutazone -rifampin -rufinamide -some medicines to treat HIV infection like atazanavir, indinavir, lopinavir, nelfinavir, tipranavir, ritonavir -St. John's wort This list may not describe all possible interactions.  Give your health care provider a list of all the medicines, herbs, non-prescription drugs, or dietary supplements you use. Also tell them if you smoke, drink alcohol, or use illegal drugs. Some items may interact with your medicine. What should I watch for while using this medicine? This product does not protect you against HIV infection (AIDS) or other sexually transmitted diseases. You should be able to feel the implant by pressing your fingertips over the skin where it was inserted. Contact your doctor if you cannot feel the implant, and use a non-hormonal birth control method (such as condoms) until your doctor confirms that the implant is in place. If you feel that the implant may have broken or become bent while in your arm, contact your healthcare provider. What side effects may I notice from receiving this medicine? Side effects that you should report to your doctor or health care professional as soon as possible: -allergic reactions like skin rash, itching or hives, swelling of the face, lips, or tongue -breast lumps -changes in emotions or moods -depressed mood -heavy or prolonged menstrual bleeding -pain, irritation, swelling, or bruising at the insertion site -scar at site of insertion -signs of infection at the insertion site such as fever, and skin redness, pain or discharge -signs of pregnancy -signs and symptoms of a blood clot such as breathing problems; changes in vision; chest pain; severe, sudden headache; pain, swelling, warmth in the leg; trouble speaking; sudden numbness or weakness of the face, arm or leg -signs and symptoms of liver injury like dark yellow or brown urine; general ill feeling or flu-like symptoms; light-colored stools; loss of appetite; nausea; right upper belly pain; unusually weak or tired; yellowing of the   eyes or skin -unusual vaginal bleeding, discharge -signs and symptoms of a stroke like changes in vision; confusion; trouble speaking or understanding;  severe headaches; sudden numbness or weakness of the face, arm or leg; trouble walking; dizziness; loss of balance or coordination Side effects that usually do not require medical attention (report to your doctor or health care professional if they continue or are bothersome): -acne -back pain -breast pain -changes in weight -dizziness -general ill feeling or flu-like symptoms -headache -irregular menstrual bleeding -nausea -sore throat -vaginal irritation or inflammation This list may not describe all possible side effects. Call your doctor for medical advice about side effects. You may report side effects to FDA at 1-800-FDA-1088. Where should I keep my medicine? This drug is given in a hospital or clinic and will not be stored at home. NOTE: This sheet is a summary. It may not cover all possible information. If you have questions about this medicine, talk to your doctor, pharmacist, or health care provider.  2018 Elsevier/Gold Standard (2015-08-01 11:19:22) Intrauterine Device Information An intrauterine device (IUD) is inserted into your uterus to prevent pregnancy. There are two types of IUDs available:  Copper IUD-This type of IUD is wrapped in copper wire and is placed inside the uterus. Copper makes the uterus and fallopian tubes produce a fluid that kills sperm. The copper IUD can stay in place for 10 years.  Hormone IUD-This type of IUD contains the hormone progestin (synthetic progesterone). The hormone thickens the cervical mucus and prevents sperm from entering the uterus. It also thins the uterine lining to prevent implantation of a fertilized egg. The hormone can weaken or kill the sperm that get into the uterus. One type of hormone IUD can stay in place for 5 years, and another type can stay in place for 3 years.  Your health care provider will make sure you are a good candidate for a contraceptive IUD. Discuss with your health care provider the possible side  effects. Advantages of an intrauterine device  IUDs are highly effective, reversible, long acting, and low maintenance.  There are no estrogen-related side effects.  An IUD can be used when breastfeeding.  IUDs are not associated with weight gain.  The copper IUD works immediately after insertion.  The hormone IUD works right away if inserted within 7 days of your period starting. You will need to use a backup method of birth control for 7 days if the hormone IUD is inserted at any other time in your cycle.  The copper IUD does not interfere with your female hormones.  The hormone IUD can make heavy menstrual periods lighter and decrease cramping.  The hormone IUD can be used for 3 or 5 years.  The copper IUD can be used for 10 years. Disadvantages of an intrauterine device  The hormone IUD can be associated with irregular bleeding patterns.  The copper IUD can make your menstrual flow heavier and more painful.  You may experience cramping and vaginal bleeding after insertion. This information is not intended to replace advice given to you by your health care provider. Make sure you discuss any questions you have with your health care provider. Document Released: 12/17/2003 Document Revised: 06/20/2015 Document Reviewed: 07/03/2012 Elsevier Interactive Patient Education  2017 Elsevier Inc.  

## 2017-11-16 ENCOUNTER — Ambulatory Visit (HOSPITAL_COMMUNITY)
Admission: RE | Admit: 2017-11-16 | Discharge: 2017-11-16 | Disposition: A | Discharge status: Home / Self Care | Payer: Medicaid Other | Source: Ambulatory Visit | Attending: Obstetrics and Gynecology | Admitting: Obstetrics and Gynecology

## 2017-11-16 ENCOUNTER — Other Ambulatory Visit (HOSPITAL_COMMUNITY): Payer: Self-pay | Admitting: *Deleted

## 2017-11-16 DIAGNOSIS — Z363 Encounter for antenatal screening for malformations: Secondary | ICD-10-CM | POA: Diagnosis not present

## 2017-11-16 DIAGNOSIS — O43102 Malformation of placenta, unspecified, second trimester: Secondary | ICD-10-CM | POA: Diagnosis not present

## 2017-11-16 DIAGNOSIS — O0992 Supervision of high risk pregnancy, unspecified, second trimester: Secondary | ICD-10-CM | POA: Diagnosis present

## 2017-11-16 DIAGNOSIS — Z362 Encounter for other antenatal screening follow-up: Secondary | ICD-10-CM

## 2017-11-16 DIAGNOSIS — Z3A22 22 weeks gestation of pregnancy: Secondary | ICD-10-CM | POA: Diagnosis not present

## 2017-11-16 DIAGNOSIS — O99312 Alcohol use complicating pregnancy, second trimester: Secondary | ICD-10-CM | POA: Diagnosis not present

## 2017-11-16 DIAGNOSIS — O99332 Smoking (tobacco) complicating pregnancy, second trimester: Secondary | ICD-10-CM | POA: Diagnosis not present

## 2017-11-16 DIAGNOSIS — O0990 Supervision of high risk pregnancy, unspecified, unspecified trimester: Secondary | ICD-10-CM

## 2017-12-07 ENCOUNTER — Encounter: Payer: Self-pay | Admitting: Obstetrics and Gynecology

## 2017-12-15 ENCOUNTER — Ambulatory Visit (INDEPENDENT_AMBULATORY_CARE_PROVIDER_SITE_OTHER): Payer: Medicaid Other | Admitting: Obstetrics & Gynecology

## 2017-12-15 VITALS — BP 107/69 | HR 105 | Wt 205.6 lb

## 2017-12-15 DIAGNOSIS — O0990 Supervision of high risk pregnancy, unspecified, unspecified trimester: Secondary | ICD-10-CM

## 2017-12-15 DIAGNOSIS — O0992 Supervision of high risk pregnancy, unspecified, second trimester: Secondary | ICD-10-CM

## 2017-12-15 DIAGNOSIS — M5430 Sciatica, unspecified side: Secondary | ICD-10-CM

## 2017-12-15 NOTE — Progress Notes (Signed)
Patient reports good fetal movement, denies pain. 

## 2017-12-15 NOTE — Patient Instructions (Signed)
Sciatica Sciatica is pain, numbness, weakness, or tingling along your sciatic nerve. The sciatic nerve starts in the lower back and goes down the back of each leg. Sciatica happens when this nerve is pinched or has pressure put on it. Sciatica usually goes away on its own or with treatment. Sometimes, sciatica may keep coming back (recur). Follow these instructions at home: Medicines  Take over-the-counter and prescription medicines only as told by your doctor.  Do not drive or use heavy machinery while taking prescription pain medicine. Managing pain  If directed, put ice on the affected area. ? Put ice in a plastic bag. ? Place a towel between your skin and the bag. ? Leave the ice on for 20 minutes, 2-3 times a day.  After icing, apply heat to the affected area before you exercise or as often as told by your doctor. Use the heat source that your doctor tells you to use, such as a moist heat pack or a heating pad. ? Place a towel between your skin and the heat source. ? Leave the heat on for 20-30 minutes. ? Remove the heat if your skin turns bright red. This is especially important if you are unable to feel pain, heat, or cold. You may have a greater risk of getting burned. Activity  Return to your normal activities as told by your doctor. Ask your doctor what activities are safe for you. ? Avoid activities that make your sciatica worse.  Take short rests during the day. Rest in a lying or standing position. This is usually better than sitting to rest. ? When you rest for a long time, do some physical activity or stretching between periods of rest. ? Avoid sitting for a long time without moving. Get up and move around at least one time each hour.  Exercise and stretch regularly, as told by your doctor.  Do not lift anything that is heavier than 10 lb (4.5 kg) while you have symptoms of sciatica. ? Avoid lifting heavy things even when you do not have symptoms. ? Avoid lifting heavy  things over and over.  When you lift objects, always lift in a way that is safe for your body. To do this, you should: ? Bend your knees. ? Keep the object close to your body. ? Avoid twisting. General instructions  Use good posture. ? Avoid leaning forward when you are sitting. ? Avoid hunching over when you are standing.  Stay at a healthy weight.  Wear comfortable shoes that support your feet. Avoid wearing high heels.  Avoid sleeping on a mattress that is too soft or too hard. You might have less pain if you sleep on a mattress that is firm enough to support your back.  Keep all follow-up visits as told by your doctor. This is important. Contact a doctor if:  You have pain that: ? Wakes you up when you are sleeping. ? Gets worse when you lie down. ? Is worse than the pain you have had in the past. ? Lasts longer than 4 weeks.  You lose weight for without trying. Get help right away if:  You cannot control when you pee (urinate) or poop (have a bowel movement).  You have weakness in any of these areas and it gets worse. ? Lower back. ? Lower belly (pelvis). ? Butt (buttocks). ? Legs.  You have redness or swelling of your back.  You have a burning feeling when you pee. This information is not intended to replace   advice given to you by your health care provider. Make sure you discuss any questions you have with your health care provider. Document Released: 10/22/2007 Document Revised: 06/20/2015 Document Reviewed: 09/21/2014 Elsevier Interactive Patient Education  2018 Elsevier Inc.  

## 2017-12-15 NOTE — Progress Notes (Signed)
   PRENATAL VISIT NOTE  Subjective:  Amanda Wagner is a 32 y.o. G2P1001 at 4051w0d being seen today for ongoing prenatal care.  She is currently monitored for the following issues for this low-risk pregnancy and has Alcohol use disorder, moderate, dependence (HCC); History of ADHD; History of attempted suicide; Supervision of high risk pregnancy, antepartum; History of cardiac radiofrequency ablation; and Sciatic leg pain on their problem list.  Patient reports backache.  Contractions: Not present. Vag. Bleeding: None.  Movement: Present. Denies leaking of fluid.   The following portions of the patient's history were reviewed and updated as appropriate: allergies, current medications, past family history, past medical history, past social history, past surgical history and problem list. Problem list updated.  Objective:   Vitals:   12/15/17 0933  BP: 107/69  Pulse: (!) 105  Weight: 205 lb 9.6 oz (93.3 kg)    Fetal Status: Fetal Heart Rate (bpm): 156   Movement: Present     General:  Alert, oriented and cooperative. Patient is in no acute distress.  Skin: Skin is warm and dry. No rash noted.   Cardiovascular: Normal heart rate noted  Respiratory: Normal respiratory effort, no problems with respiration noted  Abdomen: Soft, gravid, appropriate for gestational age.  Pain/Pressure: Absent     Pelvic: Cervical exam deferred        Extremities: Normal range of motion.  Edema: None  Mental Status: Normal mood and affect. Normal behavior. Normal judgment and thought content.   Assessment and Plan:  Pregnancy: G2P1001 at 2351w0d  1. Supervision of high risk pregnancy, antepartum   2. Sciatic leg pain Instructions given for management  Preterm labor symptoms and general obstetric precautions including but not limited to vaginal bleeding, contractions, leaking of fluid and fetal movement were reviewed in detail with the patient. Please refer to After Visit Summary for other counseling  recommendations.  Return in about 4 weeks (around 01/12/2018) for 2 hr.  Future Appointments  Date Time Provider Department Center  01/12/2018  8:00 AM [facility] None  01/12/2018  8:30 AM Hermina StaggersErvin, Michael L, MD [facility] None  01/14/2018  2:00 PM [facility] US 3 [facility]    Scheryl DarterJames Coltin Casher, MD

## 2018-01-10 ENCOUNTER — Encounter: Payer: Self-pay | Admitting: Obstetrics

## 2018-01-12 ENCOUNTER — Ambulatory Visit (INDEPENDENT_AMBULATORY_CARE_PROVIDER_SITE_OTHER): Payer: Medicaid Other | Admitting: Obstetrics and Gynecology

## 2018-01-12 ENCOUNTER — Other Ambulatory Visit: Payer: Medicaid Other

## 2018-01-12 ENCOUNTER — Encounter: Payer: Self-pay | Admitting: Obstetrics and Gynecology

## 2018-01-12 VITALS — BP 108/73 | HR 90 | Wt 209.0 lb

## 2018-01-12 DIAGNOSIS — O0990 Supervision of high risk pregnancy, unspecified, unspecified trimester: Secondary | ICD-10-CM

## 2018-01-12 DIAGNOSIS — Z23 Encounter for immunization: Secondary | ICD-10-CM | POA: Diagnosis not present

## 2018-01-12 NOTE — Progress Notes (Signed)
ROB w/ No complaints T-dap received

## 2018-01-12 NOTE — Progress Notes (Signed)
Subjective:  Amanda Wagner is a 32 y.o. G2P1001 at 4557w0d being seen today for ongoing prenatal care.  She is currently monitored for the following issues for this high-risk pregnancy and has Alcohol use disorder, moderate, dependence (HCC); History of ADHD; History of attempted suicide; Supervision of high risk pregnancy, antepartum; History of cardiac radiofrequency ablation; and Sciatic leg pain on their problem list.  Patient reports no complaints.  Contractions: Irritability. Vag. Bleeding: None.  Movement: Present. Denies leaking of fluid.   The following portions of the patient's history were reviewed and updated as appropriate: allergies, current medications, past family history, past medical history, past social history, past surgical history and problem list. Problem list updated.  Objective:   Vitals:   01/12/18 0821  BP: 108/73  Pulse: 90  Weight: 209 lb (94.8 kg)    Fetal Status: Fetal Heart Rate (bpm): 154   Movement: Present     General:  Alert, oriented and cooperative. Patient is in no acute distress.  Skin: Skin is warm and dry. No rash noted.   Cardiovascular: Normal heart rate noted  Respiratory: Normal respiratory effort, no problems with respiration noted  Abdomen: Soft, gravid, appropriate for gestational age. Pain/Pressure: Present     Pelvic:  Cervical exam deferred        Extremities: Normal range of motion.  Edema: Trace  Mental Status: Normal mood and affect. Normal behavior. Normal judgment and thought content.   Urinalysis:      Assessment and Plan:  Pregnancy: G2P1001 at 1257w0d  1. Supervision of high risk pregnancy, antepartum Stable Receiving consoling at [facility] in [place] - Glucose Tolerance, 2 Hours w/1 Hour - CBC - RPR - HIV Antibody (routine testing w rflx) - Tdap vaccine greater than or equal to 7yo IM  Preterm labor symptoms and general obstetric precautions including but not limited to vaginal bleeding, contractions, leaking of fluid and  fetal movement were reviewed in detail with the patient. Please refer to After Visit Summary for other counseling recommendations.  Return in about 2 weeks (around 01/26/2018) for OB visit.   Amanda Wagner, Amanda Wagner L, MD

## 2018-01-13 LAB — GLUCOSE TOLERANCE, 2 HOURS W/ 1HR
GLUCOSE, 1 HOUR: 121 mg/dL (ref 65–179)
GLUCOSE, FASTING: 68 mg/dL (ref 65–91)
Glucose, 2 hour: 84 mg/dL (ref 65–152)

## 2018-01-13 LAB — CBC
Hematocrit: 27.5 % — ABNORMAL LOW (ref 34.0–46.6)
Hemoglobin: 9.4 g/dL — ABNORMAL LOW (ref 11.1–15.9)
MCH: 30.8 pg (ref 26.6–33.0)
MCHC: 34.2 g/dL (ref 31.5–35.7)
MCV: 90 fL (ref 79–97)
PLATELETS: 264 10*3/uL (ref 150–450)
RBC: 3.05 x10E6/uL — ABNORMAL LOW (ref 3.77–5.28)
RDW: 13.3 % (ref 12.3–15.4)
WBC: 8.6 10*3/uL (ref 3.4–10.8)

## 2018-01-13 LAB — HIV ANTIBODY (ROUTINE TESTING W REFLEX): HIV Screen 4th Generation wRfx: NONREACTIVE

## 2018-01-13 LAB — RPR: RPR: NONREACTIVE

## 2018-01-14 ENCOUNTER — Encounter (HOSPITAL_COMMUNITY): Payer: Self-pay

## 2018-01-14 ENCOUNTER — Ambulatory Visit (HOSPITAL_COMMUNITY)
Admission: RE | Admit: 2018-01-14 | Discharge: 2018-01-14 | Disposition: A | Discharge status: Home / Self Care | Payer: Medicaid Other | Source: Ambulatory Visit | Attending: Obstetrics and Gynecology | Admitting: Obstetrics and Gynecology

## 2018-01-14 DIAGNOSIS — Z3A31 31 weeks gestation of pregnancy: Secondary | ICD-10-CM

## 2018-01-14 DIAGNOSIS — O43102 Malformation of placenta, unspecified, second trimester: Secondary | ICD-10-CM | POA: Diagnosis not present

## 2018-01-14 DIAGNOSIS — O99332 Smoking (tobacco) complicating pregnancy, second trimester: Secondary | ICD-10-CM | POA: Diagnosis not present

## 2018-01-14 DIAGNOSIS — O43193 Other malformation of placenta, third trimester: Secondary | ICD-10-CM | POA: Diagnosis not present

## 2018-01-14 DIAGNOSIS — Z362 Encounter for other antenatal screening follow-up: Secondary | ICD-10-CM | POA: Diagnosis not present

## 2018-01-14 DIAGNOSIS — O99312 Alcohol use complicating pregnancy, second trimester: Secondary | ICD-10-CM

## 2018-01-14 HISTORY — DX: Pre-excitation syndrome: I45.6

## 2018-01-17 ENCOUNTER — Other Ambulatory Visit (HOSPITAL_COMMUNITY): Payer: Self-pay | Admitting: *Deleted

## 2018-01-17 DIAGNOSIS — O43103 Malformation of placenta, unspecified, third trimester: Secondary | ICD-10-CM

## 2018-01-26 NOTE — L&D Delivery Note (Signed)
OB/GYN Faculty Practice Delivery Note  Amanda Wagner is a 33 y.o. G2P1001 s/p SVD at [redacted]w[redacted]d She was admitted for early labor, augmentation for new-onset gestational hypertension.   ROM: 0h 129mith clear fluid (AROM or forebag at time of delivery) GBS Status: negative Maximum Maternal Temperature: Temp (48hrs), Avg:98.4 F (36.9 C), Min:98.3 F (36.8 C), Max:98.5 F (36.9 C)  Labor Progress: . Admitted at 4-5 cm, irregularly contracting . Started on pitocin and progressed to 8cm with pressure . Epidural placed . Progressed to complete  Delivery Date/Time: 03/16/18 at 0457 Delivery: Called to room and patient was complete and pushing. Head delivered ROA. No nuchal cord present. Shoulder and body delivered in usual fashion. Infant with spontaneous cry, placed on mother's abdomen, dried and stimulated. Cord clamped x 2 after 1-minute delay, and cut by father of baby. Cord blood drawn. Placenta delivered spontaneously with gentle cord traction. Fundus firm with massage and Pitocin. Labia, perineum, vagina, and cervix inspected inspected with 1st degree laceration.   Placenta: spontaneous, intact with known succenturiate lobe present, 3-vessel cord Complications: none Lacerations: 1st degree repaired with 3-0 Vicryl  hemostatic superficial periurethral lacerations EBL: 147cc Analgesia: epidural  Postpartum Planning _0  message to sent to schedule follow-up  _1  vaccines UTD - MMR needed postpartum   Infant: Vigorous female  APGARs 9, 9  weight pending  Horice Carrero S. WaJuleen ChinaDO OB/GYN Fellow, Faculty Practice

## 2018-01-27 ENCOUNTER — Encounter: Payer: Self-pay | Admitting: Obstetrics and Gynecology

## 2018-02-03 ENCOUNTER — Ambulatory Visit (INDEPENDENT_AMBULATORY_CARE_PROVIDER_SITE_OTHER): Payer: Medicaid Other | Admitting: Obstetrics & Gynecology

## 2018-02-03 ENCOUNTER — Encounter: Payer: Self-pay | Admitting: Obstetrics & Gynecology

## 2018-02-03 VITALS — BP 110/76 | HR 101 | Wt 218.9 lb

## 2018-02-03 DIAGNOSIS — O0993 Supervision of high risk pregnancy, unspecified, third trimester: Secondary | ICD-10-CM

## 2018-02-03 DIAGNOSIS — O0990 Supervision of high risk pregnancy, unspecified, unspecified trimester: Secondary | ICD-10-CM

## 2018-02-03 DIAGNOSIS — Z3A34 34 weeks gestation of pregnancy: Secondary | ICD-10-CM

## 2018-02-03 NOTE — Progress Notes (Signed)
   PRENATAL VISIT NOTE  Subjective:  Amanda Wagner is a 33 y.o. G2P1001 at [redacted]w[redacted]d being seen today for ongoing prenatal care.  She is currently monitored for the following issues for this high-risk pregnancy and has Alcohol use disorder, moderate, dependence (HCC); History of ADHD; History of attempted suicide; Supervision of high risk pregnancy, antepartum; History of cardiac radiofrequency ablation; and Sciatic leg pain on their problem list.  Patient reports no complaints.  Contractions: Irregular. Vag. Bleeding: None.  Movement: Present. Denies leaking of fluid.   The following portions of the patient's history were reviewed and updated as appropriate: allergies, current medications, past family history, past medical history, past social history, past surgical history and problem list. Problem list updated.  Objective:   Vitals:   02/03/18 1615 02/03/18 1631  BP: 129/84 110/76  Pulse: (!) 101   Weight: 218 lb 14.4 oz (99.3 kg)     Fetal Status: Fetal Heart Rate (bpm): 150   Movement: Present     General:  Alert, oriented and cooperative. Patient is in no acute distress.  Skin: Skin is warm and dry. No rash noted.   Cardiovascular: Normal heart rate noted  Respiratory: Normal respiratory effort, no problems with respiration noted  Abdomen: Soft, gravid, appropriate for gestational age.  Pain/Pressure: Present     Pelvic: Cervical exam deferred        Extremities: Normal range of motion.  Edema: Trace  Mental Status: Normal mood and affect. Normal behavior. Normal judgment and thought content.   Assessment and Plan:  Pregnancy: G2P1001 at [redacted]w[redacted]d  1. Supervision of high risk pregnancy, antepartum Check BP next week  Preterm labor symptoms and general obstetric precautions including but not limited to vaginal bleeding, contractions, leaking of fluid and fetal movement were reviewed in detail with the patient. Please refer to After Visit Summary for other counseling recommendations.   Return in about 1 week (around 02/10/2018).  Future Appointments  Date Time Provider Department Center  02/11/2018  2:00 PM [facility] [place] 3 [facility]    Scheryl Darter, MD

## 2018-02-03 NOTE — Patient Instructions (Signed)

## 2018-02-10 ENCOUNTER — Ambulatory Visit (INDEPENDENT_AMBULATORY_CARE_PROVIDER_SITE_OTHER): Payer: Medicaid Other | Admitting: Obstetrics and Gynecology

## 2018-02-10 ENCOUNTER — Encounter: Payer: Self-pay | Admitting: Obstetrics and Gynecology

## 2018-02-10 VITALS — BP 125/70 | HR 83 | Wt 219.0 lb

## 2018-02-10 DIAGNOSIS — O0990 Supervision of high risk pregnancy, unspecified, unspecified trimester: Secondary | ICD-10-CM

## 2018-02-10 NOTE — Progress Notes (Signed)
   PRENATAL VISIT NOTE  Subjective:  Amanda Wagner is a 33 y.o. G2P1001 at [redacted]w[redacted]d being seen today for ongoing prenatal care.  She is currently monitored for the following issues for this high-risk pregnancy and has Alcohol use disorder, moderate, dependence (HCC); History of ADHD; History of attempted suicide; Supervision of high risk pregnancy, antepartum; History of cardiac radiofrequency ablation; and Sciatic leg pain on their problem list.  Patient reports no complaints.  Contractions: Irregular. Vag. Bleeding: None.  Movement: Present. Denies leaking of fluid.   The following portions of the patient's history were reviewed and updated as appropriate: allergies, current medications, past family history, past medical history, past social history, past surgical history and problem list. Problem list updated.  Objective:   Vitals:   02/10/18 1604  BP: 125/70  Pulse: 83  Weight: 219 lb (99.3 kg)    Fetal Status: Fetal Heart Rate (bpm): 134 Fundal Height: 35 cm Movement: Present     General:  Alert, oriented and cooperative. Patient is in no acute distress.  Skin: Skin is warm and dry. No rash noted.   Cardiovascular: Normal heart rate noted  Respiratory: Normal respiratory effort, no problems with respiration noted  Abdomen: Soft, gravid, appropriate for gestational age.  Pain/Pressure: Present     Pelvic: Cervical exam deferred        Extremities: Normal range of motion.  Edema: Trace  Mental Status: Normal mood and affect. Normal behavior. Normal judgment and thought content.   Assessment and Plan:  Pregnancy: G2P1001 at [redacted]w[redacted]d  1. Supervision of high risk pregnancy, antepartum Patient is doing well without complaints Cultures next visit Follow up growth ultrasound and placental assessment tomororw   Preterm labor symptoms and general obstetric precautions including but not limited to vaginal bleeding, contractions, leaking of fluid and fetal movement were reviewed in detail  with the patient. Please refer to After Visit Summary for other counseling recommendations.  Return in about 1 week (around 02/17/2018) for ROB.  Future Appointments  Date Time Provider Department Center  02/11/2018  2:00 PM [facility] [place] 3 [facility]    Catalina Antigua, MD

## 2018-02-10 NOTE — Progress Notes (Signed)
Pt here for ROB [redacted]w[redacted]d.

## 2018-02-11 ENCOUNTER — Encounter (HOSPITAL_COMMUNITY): Payer: Self-pay

## 2018-02-11 ENCOUNTER — Other Ambulatory Visit (HOSPITAL_COMMUNITY): Payer: Self-pay | Admitting: *Deleted

## 2018-02-11 ENCOUNTER — Ambulatory Visit (HOSPITAL_COMMUNITY)
Admission: RE | Admit: 2018-02-11 | Discharge: 2018-02-11 | Disposition: A | Discharge status: Home / Self Care | Payer: Medicaid Other | Source: Ambulatory Visit | Attending: Obstetrics and Gynecology | Admitting: Obstetrics and Gynecology

## 2018-02-11 DIAGNOSIS — O43102 Malformation of placenta, unspecified, second trimester: Secondary | ICD-10-CM | POA: Diagnosis not present

## 2018-02-11 DIAGNOSIS — O43103 Malformation of placenta, unspecified, third trimester: Secondary | ICD-10-CM | POA: Diagnosis not present

## 2018-02-11 DIAGNOSIS — O99312 Alcohol use complicating pregnancy, second trimester: Secondary | ICD-10-CM

## 2018-02-11 DIAGNOSIS — O99332 Smoking (tobacco) complicating pregnancy, second trimester: Secondary | ICD-10-CM | POA: Diagnosis not present

## 2018-02-11 DIAGNOSIS — Z362 Encounter for other antenatal screening follow-up: Secondary | ICD-10-CM | POA: Diagnosis not present

## 2018-02-11 DIAGNOSIS — Z3A35 35 weeks gestation of pregnancy: Secondary | ICD-10-CM

## 2018-02-17 ENCOUNTER — Ambulatory Visit (INDEPENDENT_AMBULATORY_CARE_PROVIDER_SITE_OTHER): Payer: Medicaid Other

## 2018-02-17 ENCOUNTER — Other Ambulatory Visit (HOSPITAL_COMMUNITY)
Admission: RE | Admit: 2018-02-17 | Discharge: 2018-02-17 | Disposition: A | Discharge status: Home / Self Care | Payer: Medicaid Other | Source: Ambulatory Visit

## 2018-02-17 VITALS — BP 122/78 | HR 85 | Wt 219.0 lb

## 2018-02-17 DIAGNOSIS — O0990 Supervision of high risk pregnancy, unspecified, unspecified trimester: Secondary | ICD-10-CM

## 2018-02-17 DIAGNOSIS — O0993 Supervision of high risk pregnancy, unspecified, third trimester: Secondary | ICD-10-CM

## 2018-02-17 LAB — OB RESULTS CONSOLE GC/CHLAMYDIA
Chlamydia: NEGATIVE
Gonorrhea: NEGATIVE

## 2018-02-17 LAB — OB RESULTS CONSOLE GBS: GBS: NEGATIVE

## 2018-02-17 NOTE — Progress Notes (Signed)
   PRENATAL VISIT NOTE  Subjective:  Amanda Wagner is a 33 y.o. G2P1001 at 1221w0d being seen today for ongoing prenatal care.  She is currently monitored for the following issues for this high-risk pregnancy and has Alcohol use disorder, moderate, dependence (HCC); History of ADHD; History of attempted suicide; Supervision of high risk pregnancy, antepartum; History of cardiac radiofrequency ablation; and Sciatic leg pain on their problem list.  Patient reports pedial edema that resolves with elevation and is not noted upon waking.  Contractions: Irritability. Vag. Bleeding: None.  Movement: Present. Denies leaking of fluid.   The following portions of the patient's history were reviewed and updated as appropriate: allergies, current medications, past family history, past medical history, past social history, past surgical history and problem list. Problem list updated.  Objective:   Vitals:   02/17/18 1604  BP: 122/78  Pulse: 85  Weight: 219 lb (99.3 kg)    Fetal Status: Fetal Heart Rate (bpm): 128   Movement: Present     General:  Alert, oriented and cooperative. Patient is in no acute distress.  Skin: Skin is warm and dry. No rash noted.   Cardiovascular: Normal heart rate noted  Respiratory: Normal respiratory effort, no problems with respiration noted  Abdomen: Soft, gravid, appropriate for gestational age.  Pain/Pressure: Present     Pelvic: Cervical exam deferred        Extremities: Normal range of motion.  Edema: Mild pitting, slight indentation  Mental Status: Normal mood and affect. Normal behavior. Normal judgment and thought content.   Assessment and Plan:  Pregnancy: G2P1001 at 7021w0d  1. Supervision of high risk pregnancy, antepartum *Discussed GBS culture and need for treatment in labor in positive. - Culture, beta strep (group b only): Pending - Cervicovaginal ancillary only( [place]pot): Pending *Discussed contraception after delivery; Reports Condom usage, but  considering Nexplanon. *Discussed labor plans; desires epidural  Term labor symptoms and general obstetric precautions including but not limited to vaginal bleeding, contractions, leaking of fluid and fetal movement were reviewed in detail with the patient. Please refer to After Visit Summary for other counseling recommendations.  Return in about 1 week (around 02/24/2018) for ROB.  Future Appointments  Date Time Provider Department Center  02/23/2018  3:45 PM Gerrit Heckmly, Rithvik Orcutt, CNM [facility] None  03/11/2018  3:30 PM [facility] [place] 1 [facility]    Cherre RobinsJessica L Javin Nong, CNM

## 2018-02-21 LAB — CULTURE, BETA STREP (GROUP B ONLY): STREP GP B CULTURE: NEGATIVE

## 2018-02-22 LAB — CERVICOVAGINAL ANCILLARY ONLY
BACTERIAL VAGINITIS: POSITIVE — AB
CANDIDA VAGINITIS: NEGATIVE
Chlamydia: NEGATIVE
Neisseria Gonorrhea: NEGATIVE
Trichomonas: NEGATIVE

## 2018-02-23 ENCOUNTER — Ambulatory Visit (INDEPENDENT_AMBULATORY_CARE_PROVIDER_SITE_OTHER): Payer: Medicaid Other

## 2018-02-23 ENCOUNTER — Other Ambulatory Visit: Payer: Self-pay

## 2018-02-23 VITALS — BP 122/77 | HR 87 | Wt 225.4 lb

## 2018-02-23 DIAGNOSIS — O0990 Supervision of high risk pregnancy, unspecified, unspecified trimester: Secondary | ICD-10-CM

## 2018-02-23 DIAGNOSIS — N760 Acute vaginitis: Secondary | ICD-10-CM

## 2018-02-23 DIAGNOSIS — O0993 Supervision of high risk pregnancy, unspecified, third trimester: Secondary | ICD-10-CM

## 2018-02-23 DIAGNOSIS — B9689 Other specified bacterial agents as the cause of diseases classified elsewhere: Secondary | ICD-10-CM

## 2018-02-23 MED ORDER — METRONIDAZOLE 500 MG PO TABS: 500.0000 mg | ORAL_TABLET | Freq: Two times a day (BID) | ORAL | 0 refills | Status: DC

## 2018-02-23 NOTE — Progress Notes (Signed)
Patient ID: Amanda Wagner, female   DOB: 09-29-1985, 33 y.o.   MRN: [id number]     PRENATAL VISIT NOTE  Subjective:  Amanda Wagner is a 33 y.o. G2P1001 at 4355w6d who presents today for routine prenatal care.  She is currently being monitored for supervision of a high-risk pregnancy with problems as listed below.  Patient reports intermittent lower abdominal cramping yesterday, but none today. She endorses fetal movement.  She denies contractions and vaginal concerns including discharge, bleeding, leaking, itching, and burning. She does c/o swelling in her ankles and feet.  Patient Active Problem List   Diagnosis Date Noted  . Sciatic leg pain 12/15/2017  . History of attempted suicide 09/14/2017  . Supervision of high risk pregnancy, antepartum 09/14/2017  . History of cardiac radiofrequency ablation 09/14/2017  . Alcohol use disorder, moderate, dependence (HCC) 01/06/2016  . History of ADHD 01/06/2016    The following portions of the patient's history were reviewed and updated as appropriate: allergies, current medications, past family history, past medical history, past social history, past surgical history and problem list. Problem list updated.  Objective:   Vitals:   02/23/18 1549  BP: 122/77  Pulse: 87  Weight: 225 lb 6.4 oz (102.2 kg)    Fetal Status: Fetal Heart Rate (bpm): 136   Movement: Present     General:  Alert, oriented and cooperative. Patient is in no acute distress.  Skin: Skin is warm and dry.   Cardiovascular: Regular rate and rhythm.  Respiratory: Normal respiratory effort. CTA-Bilaterally  Abdomen: Soft, gravid, appropriate for gestational age.  Pelvic: Cervical exam deferred        Extremities: Normal range of motion.  Edema: Mild pitting, slight indentation  Mental Status: Normal mood and affect. Normal behavior. Normal judgment and thought content.   Assessment and Plan:  Pregnancy: G2P1001 at 2355w6d  1. Supervision of high risk pregnancy,  antepartum -Discussed lab results and informed of abnormal findings -Discussed ways to improve edema including elevation and hydration. -Discussed usage of Tylenol for cramping if it returns, but also evaluation in MAU for worsening of symptoms or onset of new symptoms.  -Anticipatory Guidance including weekly appts until delivery -RTO in one week  2. Bacterial vaginosis -Educated on bacterial vaginosis including causes, prevention, and treatment -Rx for Metronidazole 500mg  BID x 7 days to pharm on file, Disp 14, RF 0  Term labor symptoms and general obstetric precautions including but not limited to vaginal bleeding, contractions, leaking of fluid and fetal movement were reviewed with the patient.  Please refer to After Visit Summary for other counseling recommendations.  Return in about 1 week (around 03/02/2018) for ROB.  Future Appointments  Date Time Provider Department Center  03/02/2018  3:00 PM Sharyon CableRogers, Veronica C, CNM [facility] None  03/11/2018  3:30 PM [facility] US 1 [facility]   Cherre RobinsJessica L Raiford Fetterman, CNM 02/23/2018, 4:07 PM

## 2018-02-23 NOTE — Patient Instructions (Signed)
b Bacterial Vaginosis  Bacterial vaginosis is a vaginal infection that occurs when the normal balance of bacteria in the vagina is disrupted. It results from an overgrowth of certain bacteria. This is the most common vaginal infection among women ages 20-44. Because bacterial vaginosis increases your risk for STIs (sexually transmitted infections), getting treated can help reduce your risk for chlamydia, gonorrhea, herpes, and HIV (human immunodeficiency virus). Treatment is also important for preventing complications in pregnant women, because this condition can cause an early (premature) delivery. What are the causes? This condition is caused by an increase in harmful bacteria that are normally present in small amounts in the vagina. However, the reason that the condition develops is not fully understood. What increases the risk? The following factors may make you more likely to develop this condition:  Having a new sexual partner or multiple sexual partners.  Having unprotected sex.  Douching.  Having an intrauterine device (IUD).  Smoking.  Drug and alcohol abuse.  Taking certain antibiotic medicines.  Being pregnant. You cannot get bacterial vaginosis from toilet seats, bedding, swimming pools, or contact with objects around you. What are the signs or symptoms? Symptoms of this condition include:  Grey or white vaginal discharge. The discharge can also be watery or foamy.  A fish-like odor with discharge, especially after sexual intercourse or during menstruation.  Itching in and around the vagina.  Burning or pain with urination. Some women with bacterial vaginosis have no signs or symptoms. How is this diagnosed? This condition is diagnosed based on:  Your medical history.  A physical exam of the vagina.  Testing a sample of vaginal fluid under a microscope to look for a large amount of bad bacteria or abnormal cells. Your health care provider may use a cotton swab  or a small wooden spatula to collect the sample. How is this treated? This condition is treated with antibiotics. These may be given as a pill, a vaginal cream, or a medicine that is put into the vagina (suppository). If the condition comes back after treatment, a second round of antibiotics may be needed. Follow these instructions at home: Medicines  Take over-the-counter and prescription medicines only as told by your health care provider.  Take or use your antibiotic as told by your health care provider. Do not stop taking or using the antibiotic even if you start to feel better. General instructions  If you have a female sexual partner, tell her that you have a vaginal infection. She should see her health care provider and be treated if she has symptoms. If you have a female sexual partner, he does not need treatment.  During treatment: ? Avoid sexual activity until you finish treatment. ? Do not douche. ? Avoid alcohol as directed by your health care provider. ? Avoid breastfeeding as directed by your health care provider.  Drink enough water and fluids to keep your urine clear or pale yellow.  Keep the area around your vagina and rectum clean. ? Wash the area daily with warm water. ? Wipe yourself from front to back after using the toilet.  Keep all follow-up visits as told by your health care provider. This is important. How is this prevented?  Do not douche.  Wash the outside of your vagina with warm water only.  Use protection when having sex. This includes latex condoms and dental dams.  Limit how many sexual partners you have. To help prevent bacterial vaginosis, it is best to have sex with just one  partner (monogamous).  Make sure you and your sexual partner are tested for STIs.  Wear cotton or cotton-lined underwear.  Avoid wearing tight pants and pantyhose, especially during summer.  Limit the amount of alcohol that you drink.  Do not use any products that  contain nicotine or tobacco, such as cigarettes and e-cigarettes. If you need help quitting, ask your health care provider.  Do not use illegal drugs. Where to find more information  Centers for Disease Control and Prevention: SolutionApps.co.za  American Sexual Health Association (ASHA): www.ashastd.org  U.S. Department of Health and Health and safety inspector, Office on Women's Health: ConventionalMedicines.si or http://www.anderson-williamson.info/ Contact a health care provider if:  Your symptoms do not improve, even after treatment.  You have more discharge or pain when urinating.  You have a fever.  You have pain in your abdomen.  You have pain during sex.  You have vaginal bleeding between periods. Summary  Bacterial vaginosis is a vaginal infection that occurs when the normal balance of bacteria in the vagina is disrupted.  Because bacterial vaginosis increases your risk for STIs (sexually transmitted infections), getting treated can help reduce your risk for chlamydia, gonorrhea, herpes, and HIV (human immunodeficiency virus). Treatment is also important for preventing complications in pregnant women, because the condition can cause an early (premature) delivery.  This condition is treated with antibiotic medicines. These may be given as a pill, a vaginal cream, or a medicine that is put into the vagina (suppository). This information is not intended to replace advice given to you by your health care provider. Make sure you discuss any questions you have with your health care provider. Document Released: 01/12/2005 Document Revised: 05/18/2016 Document Reviewed: 09/28/2015 Elsevier Interactive Patient Education  2019 [employer].

## 2018-02-23 NOTE — Progress Notes (Signed)
ROB

## 2018-03-02 ENCOUNTER — Ambulatory Visit (INDEPENDENT_AMBULATORY_CARE_PROVIDER_SITE_OTHER): Payer: Medicaid Other | Admitting: Certified Nurse Midwife

## 2018-03-02 ENCOUNTER — Encounter: Payer: Self-pay | Admitting: Certified Nurse Midwife

## 2018-03-02 DIAGNOSIS — O99330 Smoking (tobacco) complicating pregnancy, unspecified trimester: Secondary | ICD-10-CM | POA: Insufficient documentation

## 2018-03-02 DIAGNOSIS — O99333 Smoking (tobacco) complicating pregnancy, third trimester: Secondary | ICD-10-CM

## 2018-03-02 DIAGNOSIS — O0993 Supervision of high risk pregnancy, unspecified, third trimester: Secondary | ICD-10-CM

## 2018-03-02 DIAGNOSIS — O0990 Supervision of high risk pregnancy, unspecified, unspecified trimester: Secondary | ICD-10-CM

## 2018-03-02 NOTE — Progress Notes (Signed)
Pt presents for ROB. Pt c/o edema in hands and ankles.

## 2018-03-02 NOTE — Progress Notes (Signed)
   PRENATAL VISIT NOTE  Subjective:  Amanda Wagner is a 33 y.o. G2P1001 at [redacted]w[redacted]d being seen today for ongoing prenatal care.  She is currently monitored for the following issues for this high-risk pregnancy and has Alcohol use disorder, moderate, dependence (HCC); History of ADHD; History of attempted suicide; Supervision of high risk pregnancy, antepartum; History of cardiac radiofrequency ablation; Sciatic leg pain; and Tobacco use during pregnancy on their problem list.  Patient reports no complaints.  Contractions: Irregular. Vag. Bleeding: None.  Movement: Present. Denies leaking of fluid.   The following portions of the patient's history were reviewed and updated as appropriate: allergies, current medications, past family history, past medical history, past social history, past surgical history and problem list. Problem list updated.  Objective:   Vitals:   03/02/18 1506  BP: 127/84  Pulse: 97  Weight: 223 lb 9.6 oz (101.4 kg)    Fetal Status: Fetal Heart Rate (bpm): 145 Fundal Height: 34 cm Movement: Present     General:  Alert, oriented and cooperative. Patient is in no acute distress.  Skin: Skin is warm and dry. No rash noted.   Cardiovascular: Normal heart rate noted  Respiratory: Normal respiratory effort, no problems with respiration noted  Abdomen: Soft, gravid, appropriate for gestational age.  Pain/Pressure: Absent     Pelvic: Cervical exam deferred        Extremities: Normal range of motion.  Edema: Mild pitting, slight indentation  Mental Status: Normal mood and affect. Normal behavior. Normal judgment and thought content.   Assessment and Plan:  Pregnancy: G2P1001 at [redacted]w[redacted]d  1. Supervision of high risk pregnancy, antepartum - Patient doing well, no complaints - Anticipatory guidance on upcoming appointments  - Educated and discussed use of EPO and RRT to induce contractions at home  - Educated on possibility of membrane sweep at next appointment   2. Maternal  tobacco use in third trimester - Uses 2-3 cigarettes daily  - Follow up as scheduled for fetal growth assessment   Term labor symptoms and general obstetric precautions including but not limited to vaginal bleeding, contractions, leaking of fluid and fetal movement were reviewed in detail with the patient. Please refer to After Visit Summary for other counseling recommendations.  Return in about 1 week (around 03/09/2018) for ROB.  Future Appointments  Date Time Provider Department Center  03/09/2018  4:00 PM Currie Paris, NP [facility] None  03/11/2018  3:30 PM [facility] [place] 1 [facility]    Sharyon Cable, CNM

## 2018-03-09 ENCOUNTER — Ambulatory Visit (INDEPENDENT_AMBULATORY_CARE_PROVIDER_SITE_OTHER): Payer: Medicaid Other | Admitting: Nurse Practitioner

## 2018-03-09 VITALS — BP 127/88 | HR 87 | Wt 222.0 lb

## 2018-03-09 DIAGNOSIS — O0990 Supervision of high risk pregnancy, unspecified, unspecified trimester: Secondary | ICD-10-CM

## 2018-03-09 DIAGNOSIS — Z3A38 38 weeks gestation of pregnancy: Secondary | ICD-10-CM

## 2018-03-09 DIAGNOSIS — O0993 Supervision of high risk pregnancy, unspecified, third trimester: Secondary | ICD-10-CM

## 2018-03-09 NOTE — Progress Notes (Signed)
    Subjective:  Amanda Wagner is a 33 y.o. G2P1001 at [redacted]w[redacted]d being seen today for ongoing prenatal care.  She is currently monitored for the following issues for this high-risk pregnancy and has Alcohol use disorder, moderate, dependence (HCC); History of ADHD; History of attempted suicide; Supervision of high risk pregnancy, antepartum; History of cardiac radiofrequency ablation; Sciatic leg pain; and Tobacco use during pregnancy on their problem list.  Patient reports had one day of vaginal bleeding when wiping after having intercourse.  Contractions: Irritability. Vag. Bleeding: Bloody Show, Scant.  Movement: Present. Denies leaking of fluid.   The following portions of the patient's history were reviewed and updated as appropriate: allergies, current medications, past family history, past medical history, past social history, past surgical history and problem list. Problem list updated.  Objective:   Vitals:   03/09/18 1610 03/09/18 1615  BP: (!) 137/92 127/88  Pulse: 86 87  Weight: 222 lb (100.7 kg)     Fetal Status: Fetal Heart Rate (bpm): 132 Fundal Height: 38 cm Movement: Present  Presentation: Vertex  General:  Alert, oriented and cooperative. Patient is in no acute distress.  Skin: Skin is warm and dry. No rash noted.   Cardiovascular: Normal heart rate noted  Respiratory: Normal respiratory effort, no problems with respiration noted  Abdomen: Soft, gravid, appropriate for gestational age. Pain/Pressure: Present     Pelvic:  Cervical exam performed Dilation: Fingertip    speculum exam - small amound of brown cloudy liquid coming from cervix similar to mucous in the cervix  Extremities: Normal range of motion.  Edema: Mild pitting, slight indentation  Mental Status: Normal mood and affect. Normal behavior. Normal judgment and thought content.   Urinalysis:      Assessment and Plan:  Pregnancy: G2P1001 at [redacted]w[redacted]d  1. Supervision of high risk pregnancy, antepartum Baby moving  well, some swelling in left ankle only, resolved by morning, denies any headaches, blurred vision and RUQ pain.  Given precautions about baby moving and signs of increasing BP - to go to MAU.  Term labor symptoms and general obstetric precautions including but not limited to vaginal bleeding, contractions, leaking of fluid and fetal movement were reviewed in detail with the patient. Please refer to After Visit Summary for other counseling recommendations.  Return in about 1 week (around 03/16/2018).  Nolene Bernheim, RN, MSN, NP-BC Nurse Practitioner, [facility] for [employer], [facility] 03/09/2018 4:32 PM

## 2018-03-09 NOTE — Patient Instructions (Signed)

## 2018-03-11 ENCOUNTER — Other Ambulatory Visit (HOSPITAL_COMMUNITY): Payer: Self-pay | Admitting: Maternal & Fetal Medicine

## 2018-03-11 ENCOUNTER — Encounter (HOSPITAL_COMMUNITY): Payer: Self-pay

## 2018-03-11 ENCOUNTER — Ambulatory Visit (HOSPITAL_COMMUNITY)
Admission: RE | Admit: 2018-03-11 | Discharge: 2018-03-11 | Disposition: A | Discharge status: Home / Self Care | Payer: Medicaid Other | Source: Ambulatory Visit | Attending: Obstetrics and Gynecology | Admitting: Obstetrics and Gynecology

## 2018-03-11 DIAGNOSIS — Z362 Encounter for other antenatal screening follow-up: Secondary | ICD-10-CM | POA: Diagnosis present

## 2018-03-11 DIAGNOSIS — O43102 Malformation of placenta, unspecified, second trimester: Secondary | ICD-10-CM

## 2018-03-11 DIAGNOSIS — Z3A39 39 weeks gestation of pregnancy: Secondary | ICD-10-CM

## 2018-03-11 DIAGNOSIS — Z87798 Personal history of other (corrected) congenital malformations: Secondary | ICD-10-CM | POA: Diagnosis not present

## 2018-03-11 DIAGNOSIS — O99333 Smoking (tobacco) complicating pregnancy, third trimester: Secondary | ICD-10-CM | POA: Insufficient documentation

## 2018-03-11 DIAGNOSIS — O99313 Alcohol use complicating pregnancy, third trimester: Secondary | ICD-10-CM | POA: Diagnosis not present

## 2018-03-14 ENCOUNTER — Other Ambulatory Visit (HOSPITAL_COMMUNITY): Payer: Self-pay | Admitting: *Deleted

## 2018-03-14 DIAGNOSIS — O4100X0 Oligohydramnios, unspecified trimester, not applicable or unspecified: Secondary | ICD-10-CM

## 2018-03-15 ENCOUNTER — Inpatient Hospital Stay (HOSPITAL_COMMUNITY)
Admission: AD | Admit: 2018-03-15 | Discharge: 2018-03-17 | DRG: 807 | Disposition: A | Discharge status: Home / Self Care | Payer: Medicaid Other | Attending: Family Medicine | Admitting: Family Medicine

## 2018-03-15 DIAGNOSIS — Z915 Personal history of self-harm: Secondary | ICD-10-CM

## 2018-03-15 DIAGNOSIS — O99330 Smoking (tobacco) complicating pregnancy, unspecified trimester: Secondary | ICD-10-CM | POA: Diagnosis present

## 2018-03-15 DIAGNOSIS — O134 Gestational [pregnancy-induced] hypertension without significant proteinuria, complicating childbirth: Principal | ICD-10-CM | POA: Diagnosis present

## 2018-03-15 DIAGNOSIS — F17210 Nicotine dependence, cigarettes, uncomplicated: Secondary | ICD-10-CM | POA: Diagnosis present

## 2018-03-15 DIAGNOSIS — Z9151 Personal history of suicidal behavior: Secondary | ICD-10-CM

## 2018-03-15 DIAGNOSIS — O139 Gestational [pregnancy-induced] hypertension without significant proteinuria, unspecified trimester: Secondary | ICD-10-CM | POA: Diagnosis present

## 2018-03-15 DIAGNOSIS — Z3A39 39 weeks gestation of pregnancy: Secondary | ICD-10-CM

## 2018-03-15 DIAGNOSIS — O133 Gestational [pregnancy-induced] hypertension without significant proteinuria, third trimester: Secondary | ICD-10-CM

## 2018-03-15 DIAGNOSIS — O479 False labor, unspecified: Secondary | ICD-10-CM

## 2018-03-15 DIAGNOSIS — Z9889 Other specified postprocedural states: Secondary | ICD-10-CM

## 2018-03-15 DIAGNOSIS — O0990 Supervision of high risk pregnancy, unspecified, unspecified trimester: Secondary | ICD-10-CM

## 2018-03-15 DIAGNOSIS — Z8659 Personal history of other mental and behavioral disorders: Secondary | ICD-10-CM

## 2018-03-15 DIAGNOSIS — O43193 Other malformation of placenta, third trimester: Secondary | ICD-10-CM | POA: Diagnosis present

## 2018-03-15 DIAGNOSIS — O99334 Smoking (tobacco) complicating childbirth: Secondary | ICD-10-CM | POA: Diagnosis present

## 2018-03-15 HISTORY — DX: Unspecified abnormal cytological findings in specimens from vagina: R87.629

## 2018-03-15 HISTORY — DX: Anogenital (venereal) warts: A63.0

## 2018-03-15 NOTE — MAU Note (Signed)
PT SAYS UC STRONG  SINCE 8 PM.  PNC  WITH FAMINA-  VE ON 12TH- CLOSED.   DENIES HSV AND MRSA. GBS- NEG

## 2018-03-16 ENCOUNTER — Encounter (HOSPITAL_COMMUNITY): Payer: Self-pay | Admitting: *Deleted

## 2018-03-16 ENCOUNTER — Inpatient Hospital Stay (HOSPITAL_COMMUNITY): Payer: Medicaid Other | Admitting: Anesthesiology

## 2018-03-16 ENCOUNTER — Encounter: Payer: Medicaid Other | Admitting: Certified Nurse Midwife

## 2018-03-16 DIAGNOSIS — F17210 Nicotine dependence, cigarettes, uncomplicated: Secondary | ICD-10-CM | POA: Diagnosis present

## 2018-03-16 DIAGNOSIS — O99334 Smoking (tobacco) complicating childbirth: Secondary | ICD-10-CM | POA: Diagnosis present

## 2018-03-16 DIAGNOSIS — O139 Gestational [pregnancy-induced] hypertension without significant proteinuria, unspecified trimester: Secondary | ICD-10-CM | POA: Diagnosis present

## 2018-03-16 DIAGNOSIS — Z8659 Personal history of other mental and behavioral disorders: Secondary | ICD-10-CM

## 2018-03-16 DIAGNOSIS — Z3A39 39 weeks gestation of pregnancy: Secondary | ICD-10-CM | POA: Diagnosis not present

## 2018-03-16 DIAGNOSIS — O43193 Other malformation of placenta, third trimester: Secondary | ICD-10-CM | POA: Diagnosis present

## 2018-03-16 DIAGNOSIS — O134 Gestational [pregnancy-induced] hypertension without significant proteinuria, complicating childbirth: Secondary | ICD-10-CM | POA: Diagnosis present

## 2018-03-16 LAB — CBC
HCT: 33.2 % — ABNORMAL LOW (ref 36.0–46.0)
HEMOGLOBIN: 11.1 g/dL — AB (ref 12.0–15.0)
MCH: 30.7 pg (ref 26.0–34.0)
MCHC: 33.4 g/dL (ref 30.0–36.0)
MCV: 92 fL (ref 80.0–100.0)
Platelets: 263 10*3/uL (ref 150–400)
RBC: 3.61 MIL/uL — AB (ref 3.87–5.11)
RDW: 14.8 % (ref 11.5–15.5)
WBC: 11.9 10*3/uL — ABNORMAL HIGH (ref 4.0–10.5)

## 2018-03-16 LAB — URINALYSIS, ROUTINE W REFLEX MICROSCOPIC
Bilirubin Urine: NEGATIVE
Glucose, UA: NEGATIVE mg/dL
Hgb urine dipstick: NEGATIVE
KETONES UR: NEGATIVE mg/dL
LEUKOCYTE UA: NEGATIVE
NITRITE: NEGATIVE
Protein, ur: NEGATIVE mg/dL
Specific Gravity, Urine: 1.015 (ref 1.005–1.030)
pH: 7 (ref 5.0–8.0)

## 2018-03-16 LAB — COMPREHENSIVE METABOLIC PANEL
ALBUMIN: 3 g/dL — AB (ref 3.5–5.0)
ALK PHOS: 78 U/L (ref 38–126)
ALT: 13 U/L (ref 0–44)
ANION GAP: 9 (ref 5–15)
AST: 19 U/L (ref 15–41)
BUN: 6 mg/dL (ref 6–20)
CO2: 21 mmol/L — ABNORMAL LOW (ref 22–32)
Calcium: 8.9 mg/dL (ref 8.9–10.3)
Chloride: 106 mmol/L (ref 98–111)
Creatinine, Ser: 0.6 mg/dL (ref 0.44–1.00)
GFR calc Af Amer: >60 mL/min (ref >60)
Glucose, Bld: 78 mg/dL (ref 70–99)
Potassium: 3.3 mmol/L — ABNORMAL LOW (ref 3.5–5.1)
SODIUM: 136 mmol/L (ref 135–145)
Total Bilirubin: 0.6 mg/dL (ref 0.3–1.2)
Total Protein: 6.9 g/dL (ref 6.5–8.1)

## 2018-03-16 LAB — ABO/RH: ABO/RH(D): A POS

## 2018-03-16 LAB — PROTEIN / CREATININE RATIO, URINE
CREATININE, URINE: 65 mg/dL
PROTEIN CREATININE RATIO: 0.12 mg/mg{creat} (ref 0.00–0.15)
Total Protein, Urine: 8 mg/dL

## 2018-03-16 LAB — RPR: RPR Ser Ql: NONREACTIVE

## 2018-03-16 LAB — TYPE AND SCREEN
ABO/RH(D): A POS
Antibody Screen: NEGATIVE

## 2018-03-16 MED ORDER — PHENYLEPHRINE 40 MCG/ML (10ML) SYRINGE FOR IV PUSH (FOR BLOOD PRESSURE SUPPORT): 80.0000 ug | PREFILLED_SYRINGE | INTRAVENOUS | Status: DC | PRN

## 2018-03-16 MED ORDER — LACTATED RINGERS IV SOLN: 500.0000 mL | INTRAVENOUS | Status: DC | PRN

## 2018-03-16 MED ORDER — PRENATAL MULTIVITAMIN CH
1.0000 | ORAL_TABLET | Freq: Every day | ORAL | Status: DC
  Administered 2018-03-16 – 2018-03-17 (×2): 1 via ORAL
  Filled 2018-03-16 (×2): qty 1

## 2018-03-16 MED ORDER — IBUPROFEN 600 MG PO TABS
600.0000 mg | ORAL_TABLET | Freq: Four times a day (QID) | ORAL | Status: DC
  Administered 2018-03-16 – 2018-03-17 (×6): 600 mg via ORAL
  Filled 2018-03-16 (×6): qty 1

## 2018-03-16 MED ORDER — ONDANSETRON HCL 4 MG PO TABS
4.0000 mg | ORAL_TABLET | ORAL | Status: DC | PRN
  Filled 2018-03-16: qty 1

## 2018-03-16 MED ORDER — LIDOCAINE HCL (PF) 1 % IJ SOLN
30.0000 mL | INTRAMUSCULAR | Status: DC | PRN
  Administered 2018-03-16: 30 mL via SUBCUTANEOUS
  Filled 2018-03-16: qty 30

## 2018-03-16 MED ORDER — SIMETHICONE 80 MG PO CHEW: 80.0000 mg | CHEWABLE_TABLET | ORAL | Status: DC | PRN

## 2018-03-16 MED ORDER — BENZOCAINE-MENTHOL 20-0.5 % EX AERO
1.0000 "application " | INHALATION_SPRAY | CUTANEOUS | Status: DC | PRN
  Filled 2018-03-16: qty 56

## 2018-03-16 MED ORDER — ACETAMINOPHEN 325 MG PO TABS: 650.0000 mg | ORAL_TABLET | ORAL | Status: DC | PRN

## 2018-03-16 MED ORDER — SODIUM CHLORIDE (PF) 0.9 % IJ SOLN
INTRAMUSCULAR | Status: DC | PRN
  Administered 2018-03-16: 12 mL/h via EPIDURAL

## 2018-03-16 MED ORDER — PHENYLEPHRINE 40 MCG/ML (10ML) SYRINGE FOR IV PUSH (FOR BLOOD PRESSURE SUPPORT)
80.0000 ug | PREFILLED_SYRINGE | INTRAVENOUS | Status: DC | PRN
  Filled 2018-03-16: qty 10

## 2018-03-16 MED ORDER — LIDOCAINE HCL (PF) 1 % IJ SOLN
INTRAMUSCULAR | Status: DC | PRN
  Administered 2018-03-16 (×2): 5 mL via EPIDURAL

## 2018-03-16 MED ORDER — FLEET ENEMA 7-19 GM/118ML RE ENEM: 1.0000 | ENEMA | RECTAL | Status: DC | PRN

## 2018-03-16 MED ORDER — ONDANSETRON HCL 4 MG/2ML IJ SOLN: 4.0000 mg | INTRAMUSCULAR | Status: DC | PRN

## 2018-03-16 MED ORDER — TERBUTALINE SULFATE 1 MG/ML IJ SOLN
0.2500 mg | Freq: Once | INTRAMUSCULAR | Status: DC | PRN
  Filled 2018-03-16: qty 1

## 2018-03-16 MED ORDER — EPHEDRINE 5 MG/ML INJ: 10.0000 mg | INTRAVENOUS | Status: DC | PRN

## 2018-03-16 MED ORDER — OXYTOCIN 40 UNITS IN NORMAL SALINE INFUSION - SIMPLE MED
2.5000 [IU]/h | INTRAVENOUS | Status: DC
  Administered 2018-03-16: 2.5 [IU]/h via INTRAVENOUS

## 2018-03-16 MED ORDER — ONDANSETRON HCL 4 MG/2ML IJ SOLN: 4.0000 mg | Freq: Four times a day (QID) | INTRAMUSCULAR | Status: DC | PRN

## 2018-03-16 MED ORDER — FENTANYL-BUPIVACAINE-NACL 0.5-0.125-0.9 MG/250ML-% EP SOLN: 12.0000 mL/h | EPIDURAL | Status: DC | PRN

## 2018-03-16 MED ORDER — SOD CITRATE-CITRIC ACID 500-334 MG/5ML PO SOLN: 30.0000 mL | ORAL | Status: DC | PRN

## 2018-03-16 MED ORDER — DIPHENHYDRAMINE HCL 50 MG/ML IJ SOLN: 12.5000 mg | INTRAMUSCULAR | Status: DC | PRN

## 2018-03-16 MED ORDER — COCONUT OIL OIL
1.0000 "application " | TOPICAL_OIL | Status: DC | PRN
  Administered 2018-03-16: 1 via TOPICAL
  Filled 2018-03-16 (×2): qty 120

## 2018-03-16 MED ORDER — OXYTOCIN 40 UNITS IN NORMAL SALINE INFUSION - SIMPLE MED
1.0000 m[IU]/min | INTRAVENOUS | Status: DC
  Administered 2018-03-16: 2 m[IU]/min via INTRAVENOUS
  Filled 2018-03-16: qty 1000

## 2018-03-16 MED ORDER — LACTATED RINGERS IV SOLN
500.0000 mL | Freq: Once | INTRAVENOUS | Status: AC
  Administered 2018-03-16: 500 mL via INTRAVENOUS

## 2018-03-16 MED ORDER — SENNOSIDES-DOCUSATE SODIUM 8.6-50 MG PO TABS
2.0000 | ORAL_TABLET | ORAL | Status: DC
  Administered 2018-03-16: 2 via ORAL
  Filled 2018-03-16: qty 2

## 2018-03-16 MED ORDER — ZOLPIDEM TARTRATE 5 MG PO TABS: 5.0000 mg | ORAL_TABLET | Freq: Every evening | ORAL | Status: DC | PRN

## 2018-03-16 MED ORDER — FENTANYL CITRATE (PF) 100 MCG/2ML IJ SOLN
100.0000 ug | INTRAMUSCULAR | Status: DC | PRN
  Administered 2018-03-16: 100 ug via INTRAVENOUS
  Filled 2018-03-16: qty 2

## 2018-03-16 MED ORDER — WITCH HAZEL-GLYCERIN EX PADS: 1.0000 "application " | MEDICATED_PAD | CUTANEOUS | Status: DC | PRN

## 2018-03-16 MED ORDER — TETANUS-DIPHTH-ACELL PERTUSSIS 5-2.5-18.5 LF-MCG/0.5 IM SUSP: 0.5000 mL | Freq: Once | INTRAMUSCULAR | Status: DC

## 2018-03-16 MED ORDER — DIBUCAINE 1 % RE OINT
1.0000 "application " | TOPICAL_OINTMENT | RECTAL | Status: DC | PRN
  Filled 2018-03-16: qty 28

## 2018-03-16 MED ORDER — OXYTOCIN BOLUS FROM INFUSION
500.0000 mL | Freq: Once | INTRAVENOUS | Status: AC
  Administered 2018-03-16: 500 mL via INTRAVENOUS

## 2018-03-16 MED ORDER — FENTANYL-BUPIVACAINE-NACL 0.5-0.125-0.9 MG/250ML-% EP SOLN
12.0000 mL/h | EPIDURAL | Status: DC | PRN
  Filled 2018-03-16: qty 250

## 2018-03-16 MED ORDER — DIPHENHYDRAMINE HCL 25 MG PO CAPS: 25.0000 mg | ORAL_CAPSULE | Freq: Four times a day (QID) | ORAL | Status: DC | PRN

## 2018-03-16 MED ORDER — LACTATED RINGERS IV SOLN
INTRAVENOUS | Status: DC
  Administered 2018-03-16: 03:00:00 via INTRAVENOUS

## 2018-03-16 NOTE — Discharge Summary (Addendum)
Obstetrics Discharge Summary OB/GYN Faculty Practice   Patient Name: Amanda Wagner DOB: [birth year] MRN: [id number]  Date of admission: 03/15/2018 Delivering MD: Nuala Alpha   Date of discharge: 03/17/2018  Admitting diagnosis: 75 WKS, CTX Intrauterine pregnancy: [redacted]w[redacted]d    Secondary diagnosis:   Principal Problem:   Gestational hypertension Active Problems:   History of attempted suicide   History of cardiac radiofrequency ablation   Tobacco use during pregnancy   Placenta succenturiate lobe affecting fetus   History of depression    Discharge diagnosis: Term Pregnancy Delivered and Gestational Hypertension                                             Postpartum procedures: None  Complications: none  Outpatient Follow-Up: '[ ]'$  BP check  Hospital course: Amanda Wagner a 33y.o. 345w6dho was admitted for early labor, new-onset gestational hypertension- neg labs and mod range BPs. Her pregnancy was complicated by history of depression, tobacco use, SI, succenturiate lobe. Her labor course was notable for augmentation with pitocin, epidural placement. Delivery was complicated by first degree perineal laceration repaired. Please see delivery/op note for additional details. Her postpartum course was complicated by having elevated BP on PPD#1, which was resolved by Day 2. Amanda Wagner is breastfeeding without difficulty. By day of discharge, Amanda Wagner was passing flatus, urinating, eating and drinking without difficulty. Her pain was well-controlled, and Amanda Wagner was discharged home with Motrin. Amanda Wagner will follow-up in clinic in 1 week for a BP check. Patient refused enalapril in the hospital because Amanda Wagner does not want to take medicine for one elevated blood pressure. Dr. MuVanetta Shawlade aware and agrees that patient may be discharged without anti-hypertensive medicines.   Physical exam  Vitals:   03/16/18 2128 03/17/18 0527 03/17/18 1050 03/17/18 1310  BP: 122/81 (!) 148/90 120/83 124/88  Pulse: 71 75 70    Resp: 18     Temp: 98 F (36.7 C) 98.9 F (37.2 C)    TempSrc: Oral Oral    SpO2: 98% 100%    Weight:      Height:       General: well, alert Lochia: appropriate Uterine Fundus: firm Incision: N/A DVT Evaluation: No evidence of DVT seen on physical exam. Labs: Lab Results  Component Value Date   WBC 11.9 (H) 03/16/2018   HGB 11.1 (L) 03/16/2018   HCT 33.2 (L) 03/16/2018   MCV 92.0 03/16/2018   PLT 263 03/16/2018   CMP Latest Ref Rng & Units 03/16/2018  Glucose 70 - 99 mg/dL 78  BUN 6 - 20 mg/dL 6  Creatinine 0.44 - 1.00 mg/dL 0.60  Sodium 135 - 145 mmol/L 136  Potassium 3.5 - 5.1 mmol/L 3.3(L)  Chloride 98 - 111 mmol/L 106  CO2 22 - 32 mmol/L 21(L)  Calcium 8.9 - 10.3 mg/dL 8.9  Total Protein 6.5 - 8.1 g/dL 6.9  Total Bilirubin 0.3 - 1.2 mg/dL 0.6  Alkaline Phos 38 - 126 U/L 78  AST 15 - 41 U/L 19  ALT 0 - 44 U/L 13    Discharge instructions: Per After Visit Summary and "Baby and Me Booklet"  After visit meds:  Allergies as of 03/17/2018   No Known Allergies     Medication List    TAKE these medications   ibuprofen 600 MG tablet Commonly known as:  ADVIL,MOTRIN Take 1 tablet (600 mg  total) by mouth every 6 (six) hours as needed.   IRON PO Take by mouth.   measles, mumps & rubella vaccine injection Commonly known as:  MMR Inject 0.5 mLs into the skin once for 1 dose.   omega-3 acid ethyl esters 1 g capsule Commonly known as:  LOVAZA Take 1 g by mouth daily.   PREPLUS 27-1 MG Tabs Take 1 tablet by mouth daily.       Postpartum contraception: Condoms Diet: Routine Diet Activity: Advance as tolerated. Pelvic rest for 6 weeks.   Follow-up Appt: Future Appointments  Date Time Provider [place]  03/23/2018  1:15 PM [place] None  04/14/2018  1:00 PM Lajean Manes, CNM [facility] None   Please schedule this patient for Postpartum visit in: 4 weeks with the following provider: Any provider Low risk pregnancy complicated by:  history of depression, SI Delivery mode:  SVD Anticipated Birth Control:  Condoms PP Procedures needed: BP check  Schedule Integrated [place] visit: yes  Newborn Data: Live born female  Birth Weight:   APGAR: 38, 9  Newborn Delivery   Birth date/time:  03/16/2018 04:57:00 Delivery type:  Vaginal, Spontaneous    Baby Feeding: Breast Disposition:home with mother  Starr [place]  CNM 03/17/2018 5:07 PM

## 2018-03-16 NOTE — H&P (Signed)
OBSTETRIC ADMISSION HISTORY AND PHYSICAL  Amanda Wagner is a 33 y.o. female G2P1001 with IUP at [redacted]w[redacted]d by L/22 presenting for contractions and augmentation of labor for elevated blood pressure and presumed gestational hypertension.   Reports fetal movement. Denies vaginal bleeding, leakage of fluids.   She received her prenatal care at [facility].  Support person in labor: Fraser Din, father  Ultrasounds . 22w5: anterior placenta with succenturiate lobe posterior, normal anatomy U/S . 31w1: EFW 1751g, 59%  repeat growth and f/u to confirm succenturiate posterior lobe of placenta . 35w1: EFW 2671g (67%)  succenturiate lobe again noted . 39w1: [id number] (68%)  normal interval growth, subjectively low AFI 6   Prenatal History/Complications: . History of depression, attempted suicide (12/17) . Gestational hypertension - new on presentation in labor . Tobacco use . History of cardiac ablation for WPW  Past Medical History: Past Medical History:  Diagnosis Date  . ADHD   . HPV (human papilloma virus) anogenital infection   . Vaginal Pap smear, abnormal   . WPW (Wolff-Parkinson-White syndrome)     Past Surgical History: Past Surgical History:  Procedure Laterality Date  . A FLUTTER ABLATION    . WISDOM TOOTH EXTRACTION      Obstetrical History: OB History    Gravida  2   Para  1   Term  1   Preterm      AB      Living  1     SAB      TAB      Ectopic      Multiple      Live Births  1           Social History: Social History   Socioeconomic History  . Marital status: Unknown    Spouse name: Fraser Din  . Number of children: 1  . Years of education: Not on file  . Highest education level: Not on file  Occupational History  . Not on file  Social Needs  . Financial resource strain: Not hard at all  . Food insecurity:    Worry: Sometimes true    Inability: Never true  . Transportation needs:    Medical: No    Non-medical: No  Tobacco Use  . Smoking  status: Current Some Day Smoker    Packs/day: 0.25    Types: Cigarettes  . Smokeless tobacco: Never Used  . Tobacco comment: reports 'I'm using nicorette to quit'  Substance and Sexual Activity  . Alcohol use: Not Currently    Comment: reports 'excessively', does not specify; no alcohol this preg  . Drug use: Not Currently    Comment: none this preg  . Sexual activity: Yes    Birth control/protection: None  Lifestyle  . Physical activity:    Days per week: Not on file    Minutes per session: Not on file  . Stress: Not on file  Relationships  . Social connections:    Talks on phone: Patient refused    Gets together: Patient refused    Attends religious service: Patient refused    Active member of club or organization: Patient refused    Attends meetings of clubs or organizations: Patient refused    Relationship status: Patient refused  Other Topics Concern  . Not on file  Social History Narrative  . Not on file    Family History: Family History  Problem Relation Age of Onset  . Hypertension Father   . Stroke Father   . Diabetes Father   .  Diabetes Maternal Grandmother     Allergies: No Known Allergies  Medications Prior to Admission  Medication Sig Dispense Refill Last Dose  . IRON PO Take by mouth.   Past Month at Unknown time  . omega-3 acid ethyl esters (LOVAZA) 1 g capsule Take 1 g by mouth daily.   Past Month at Unknown time  . Prenatal Vit-Fe Fumarate-FA (PREPLUS) 27-1 MG TABS Take 1 tablet by mouth daily. 30 tablet 13 03/15/2018 at Unknown time  . amphetamine-dextroamphetamine (ADDERALL) 30 MG tablet    Not Taking  . cyclobenzaprine (FLEXERIL) 5 MG tablet Take 1 tablet by mouth as needed.  0 More than a month at Unknown time     Review of Systems  All systems reviewed and negative except as stated in HPI  Blood pressure 134/86, pulse 85, temperature 98.5 F (36.9 C), temperature source Oral, resp. rate 20, height 5\' 8"  (1.727 m), weight 100.1 kg, last  menstrual period 06/30/2017. General appearance: uncomfortable appearing Lungs: no respiratory distress Heart: regular rate  Abdomen: soft, non-tender; gravid  Pelvic: deferred Extremities: no LE edema Presentation: cephalic by prior checks Fetal monitoring: 130/mod/+a/-d Uterine activity: irregular Dilation: 4.5 Effacement (%): 90 Station: -2 Exam by:: Domenic Polite  Prenatal labs: ABO, Rh: A/Positive/-- (08/20 1449) Antibody: Negative (08/20 1449) Rubella: <0.90 (08/20 1449) RPR: Non Reactive (12/18 1015)  HBsAg: Negative (08/20 1449)  HIV: Non Reactive (12/18 1015)  GBS:   negative Glucola: normal 2-hr Genetic screening:  Low-risk female  Prenatal Transfer Tool  Maternal Diabetes: No Genetic Screening: Normal Maternal Ultrasounds/Referrals: Normal Fetal Ultrasounds or other Referrals:  None Maternal Substance Abuse:  No Significant Maternal Medications:  None Significant Maternal Lab Results: None  Results for orders placed or performed during the hospital encounter of 03/15/18 (from the past 24 hour(s))  Urinalysis, Routine w reflex microscopic   Collection Time: 03/16/18 12:25 AM  Result Value Ref Range   Color, Urine YELLOW YELLOW   APPearance CLEAR CLEAR   Specific Gravity, Urine 1.015 1.005 - 1.030   pH 7.0 5.0 - 8.0   Glucose, UA NEGATIVE NEGATIVE mg/dL   Hgb urine dipstick NEGATIVE NEGATIVE   Bilirubin Urine NEGATIVE NEGATIVE   Ketones, ur NEGATIVE NEGATIVE mg/dL   Protein, ur NEGATIVE NEGATIVE mg/dL   Nitrite NEGATIVE NEGATIVE   Leukocytes,Ua NEGATIVE NEGATIVE  Protein / creatinine ratio, urine   Collection Time: 03/16/18 12:25 AM  Result Value Ref Range   Creatinine, Urine 65.00 mg/dL   Total Protein, Urine 8 mg/dL   Protein Creatinine Ratio 0.12 0.00 - 0.15 mg/mg[Cre]  CBC   Collection Time: 03/16/18 12:39 AM  Result Value Ref Range   WBC 11.9 (H) 4.0 - 10.5 K/uL   RBC 3.61 (L) 3.87 - 5.11 MIL/uL   Hemoglobin 11.1 (L) 12.0 - 15.0 g/dL   HCT 57.2  (L) 62.0 - 46.0 %   MCV 92.0 80.0 - 100.0 fL   MCH 30.7 26.0 - 34.0 pg   MCHC 33.4 30.0 - 36.0 g/dL   RDW 35.5 97.4 - 16.3 %   Platelets 263 150 - 400 K/uL  Comprehensive metabolic panel   Collection Time: 03/16/18 12:39 AM  Result Value Ref Range   Sodium 136 135 - 145 mmol/L   Potassium 3.3 (L) 3.5 - 5.1 mmol/L   Chloride 106 98 - 111 mmol/L   CO2 21 (L) 22 - 32 mmol/L   Glucose, Bld 78 70 - 99 mg/dL   BUN 6 6 - 20 mg/dL   Creatinine,  Ser 0.60 0.44 - 1.00 mg/dL   Calcium 8.9 8.9 - 16.110.3 mg/dL   Total Protein 6.9 6.5 - 8.1 g/dL   Albumin 3.0 (L) 3.5 - 5.0 g/dL   AST 19 15 - 41 U/L   ALT 13 0 - 44 U/L   Alkaline Phosphatase 78 38 - 126 U/L   Total Bilirubin 0.6 0.3 - 1.2 mg/dL   GFR calc non Af Amer >60 >60 mL/min   GFR calc Af Amer >60 >60 mL/min   Anion gap 9 5 - 15    Patient Active Problem List   Diagnosis Date Noted  . Gestational hypertension 03/16/2018  . Placenta succenturiate lobe affecting fetus 03/16/2018  . History of depression 03/16/2018  . Tobacco use during pregnancy 03/02/2018  . Sciatic leg pain 12/15/2017  . History of attempted suicide 09/14/2017  . Supervision of high risk pregnancy, antepartum 09/14/2017  . History of cardiac radiofrequency ablation 09/14/2017  . Alcohol use disorder, moderate, dependence (HCC) 01/06/2016  . History of ADHD 01/06/2016    Assessment/Plan:  Amanda Wagner is a 33 y.o. G2P1001 at 2732w6d here for SOL and newly diagnosed gestational hypertension.   Labor: Spontaneous onset of labor, early and not contracting regularly. Will start pitocin.  -- pain control: desires epidural  Fetal Wellbeing: EFW 7lbs by Leopold's. Cephalic by sutures.  -- GBS (negative) -- continuous fetal monitoring - category I   GHTN: Moderate range BP. UPC 0.12, HELLP labs wnl. Asymptomatic at this time. -- continue to monitor  Postpartum Planning -- breast/condoms -- RNI/[x] Tdap/[x] flu  Jamespaul Secrist S. Earlene PlaterWallace, DO OB/GYN Fellow

## 2018-03-16 NOTE — Anesthesia Preprocedure Evaluation (Signed)
Anesthesia Evaluation  Patient identified by MRN, date of birth, ID band Patient awake    Reviewed: Allergy & Precautions, H&P , NPO status , Patient's Chart, lab work & pertinent test results  History of Anesthesia Complications Negative for: history of anesthetic complications  Airway Mallampati: II  TM Distance: >3 FB Neck ROM: full    Dental no notable dental hx. (+) Teeth Intact   Pulmonary neg pulmonary ROS, Current Smoker,    Pulmonary exam normal breath sounds clear to auscultation       Cardiovascular negative cardio ROS Normal cardiovascular exam Rhythm:regular Rate:Normal     Neuro/Psych negative neurological ROS  negative psych ROS   GI/Hepatic negative GI ROS, Neg liver ROS,   Endo/Other  negative endocrine ROS  Renal/GU negative Renal ROS  negative genitourinary   Musculoskeletal   Abdominal   Peds  Hematology negative hematology ROS (+)   Anesthesia Other Findings   Reproductive/Obstetrics (+) Pregnancy                             Anesthesia Physical Anesthesia Plan  ASA: II  Anesthesia Plan: Epidural   Post-op Pain Management:    Induction:   PONV Risk Score and Plan:   Airway Management Planned:   Additional Equipment:   Intra-op Plan:   Post-operative Plan:   Informed Consent: I have reviewed the patients History and Physical, chart, labs and discussed the procedure including the risks, benefits and alternatives for the proposed anesthesia with the patient or authorized representative who has indicated his/her understanding and acceptance.     Plan Discussed with:   Anesthesia Plan Comments:         Anesthesia Quick Evaluation  

## 2018-03-16 NOTE — Progress Notes (Signed)
Patient is a 33 y.o. now G2P2 s/p NSVD at [redacted]w[redacted]d, who was admitted for SOL with new gHTN.  Delivery Note At 4:57 AM a viable female was delivered via Vaginal, Spontaneous (Presentation: ROA; no nuchal cord ).  APGAR: 9, 9; weight pending.   Placenta status: intact, with extra lobe present. Cord: 3-vessels with the following complications: none.    Anesthesia:  Epidural Episiotomy:  No Lacerations: 1st degree;Periurethral;Perineal Suture Repair: 3.0 vicryl Est. Blood Loss (mL):   Mom to postpartum.  Baby to Couplet care / Skin to Skin.   Head delivered ROA. No nuchal cord present. Shoulder and body delivered in usual fashion. Infant with spontaneous cry, placed on mother's abdomen, dried and bulb suctioned. Cord clamped x 2 after 1-minute delay, and cut by family member. Cord blood drawn. Placenta delivered spontaneously with gentle cord traction. Fundus firm with massage and Pitocin. Perineum inspected and found to have 1st degree laceration, which was repaired with 3-0 Vicryl with good hemostasis achieved.  [facility], PGY-2 03/16/2018, 5:16 AM ,

## 2018-03-16 NOTE — Anesthesia Procedure Notes (Signed)
Epidural Patient location during procedure: OB  Staffing Anesthesiologist: Aryahna Spagna, MD Performed: anesthesiologist   Preanesthetic Checklist Completed: patient identified, site marked, surgical consent, pre-op evaluation, timeout performed, IV checked, risks and benefits discussed and monitors and equipment checked  Epidural Patient position: sitting Prep: DuraPrep Patient monitoring: heart rate, continuous pulse ox and blood pressure Approach: right paramedian Location: L3-L4 Injection technique: LOR saline  Needle:  Needle type: Tuohy  Needle gauge: 17 G Needle length: 9 cm and 9 Needle insertion depth: 6 cm Catheter type: closed end flexible Catheter size: 20 Guage Catheter at skin depth: 10 cm Test dose: negative  Assessment Events: blood not aspirated, injection not painful, no injection resistance, negative IV test and no paresthesia  Additional Notes Patient identified. Risks/Benefits/Options discussed with patient including but not limited to bleeding, infection, nerve damage, paralysis, failed block, incomplete pain control, headache, blood pressure changes, nausea, vomiting, reactions to medication both or allergic, itching and postpartum back pain. Confirmed with bedside nurse the patient's most recent platelet count. Confirmed with patient that they are not currently taking any anticoagulation, have any bleeding history or any family history of bleeding disorders. Patient expressed understanding and wished to proceed. All questions were answered. Sterile technique was used throughout the entire procedure. Please see nursing notes for vital signs. Test dose was given through epidural needle and negative prior to continuing to dose epidural or start infusion. Warning signs of high block given to the patient including shortness of breath, tingling/numbness in hands, complete motor block, or any concerning symptoms with instructions to call for help. Patient was given  instructions on fall risk and not to get out of bed. All questions and concerns addressed with instructions to call with any issues.     

## 2018-03-16 NOTE — Lactation Note (Addendum)
This note was copied from a baby's chart. Lactation Consultation Note  Patient Name: Amanda Wagner [id number] Date: 03/16/2018 Reason for consult: Initial assessment;Term P2, 16 hour female infant. Mom's feeding choice is breast and formula at admission.  Per mom, her nipples  are sore and she feels infant has not been latching well.  LC notice abrasion on nipple tip, mom was given coconut oil by nurse and LC also discussed using her EBM on breast for nipple soreness. Per mom, infant BF 20 minutes at 7:45 pm after [facility] assistance with latching infant to breast, LC notice infant was sleepy and not interested in feeding, discussed hunger cues with mom and BF infant 8 or more times within 24 hours.  Mom latched infant on left breast using cross cradle hold, infant open mouth with wide gape, suck few seconds (2 minutes) and  fell asleep. Per mom, she  want help  with latching infant to breast to know what a good latch feels like.  Per mom, latch was not pain nor did it  hurt. LC discussed importance infant's  opening mouth wide " like biting into an apple with tongue and lower jaw extended downward", nose to breast and hand and breast support to help with deep latch. Mom receptive of teaching and thankful. LC discussed I & O. Reviewed Baby & Me book's Breastfeeding Basics.  Mom knows to call Nurse or LC if she has any breastfeeding questions, concerns or need assistance with latching infant to breast.  Mom made aware of O/P services, breastfeeding support groups, community resources, and our phone # for post-discharge questions.  Maternal Data Formula Feeding for Exclusion: Yes Reason for exclusion: Mother's choice to formula and breast feed on admission Has patient been taught Hand Expression?: Yes Does the patient have breastfeeding experience prior to this delivery?: Yes  Feeding Feeding Type: Breast Fed  LATCH Score Latch: Too sleepy or reluctant, no latch achieved, no sucking  elicited.  Audible Swallowing: A few with stimulation  Type of Nipple: Everted at rest and after stimulation  Comfort (Breast/Nipple): Filling, red/small blisters or bruises, mild/mod discomfort  Hold (Positioning): Assistance needed to correctly position infant at breast and maintain latch.  LATCH Score: 5  Interventions Interventions: Breast feeding basics reviewed;Assisted with latch;Skin to skin;Expressed milk;Coconut oil;Support pillows;Adjust position;Breast compression  Lactation Tools Discussed/Used Tools: Coconut oil WIC Program: No   Consult Status Consult Status: Follow-up Date: 03/17/18 Follow-up type: In-patient    Danelle Earthly 03/16/2018, 9:06 PM

## 2018-03-16 NOTE — MAU Provider Note (Signed)
Chief Complaint:  Contractions   First Provider Initiated Contact with Patient 03/16/18 [phone]      HPI: Amanda Wagner is a 33 y.o. G2P1001 at 9w6dwho presents to maternity admissions reporting uterine contractions.  Denies headache but saw some stars when she took a shower today. States BPs have been slightly elevated in office. She reports good fetal movement, denies LOF, vaginal bleeding, vaginal itching/burning, urinary symptoms, h/a, dizziness, n/v, diarrhea, constipation or fever/chills.  She denies headache, visual changes or RUQ abdominal pain.  RN note: PT SAYS UC STRONG  SINCE 8 PM.  PNC  WITH [facility]-  VE ON 12TH- CLOSED.   DENIES HSV AND MRSA. GBS- NEG  Past Medical History: Past Medical History:  Diagnosis Date  . ADHD   . HPV (human papilloma virus) anogenital infection   . Vaginal Pap smear, abnormal   . WPW (Wolff-Parkinson-White syndrome)     Past obstetric history: OB History  Gravida Para Term Preterm AB Living  2 1 1     1   SAB TAB Ectopic Multiple Live Births          1    # Outcome Date GA Lbr Len/2nd Weight Sex Delivery Anes PTL Lv  2 Current           1 Term 01/28/09 [redacted]w[redacted]d  3402 g F Vag-Spont   LIV    Past Surgical History: Past Surgical History:  Procedure Laterality Date  . A FLUTTER ABLATION    . WISDOM TOOTH EXTRACTION      Family History: Family History  Problem Relation Age of Onset  . Hypertension Father   . Stroke Father   . Diabetes Father   . Diabetes Maternal Grandmother     Social History: Social History   Tobacco Use  . Smoking status: Current Some Day Smoker    Packs/day: 0.25    Types: Cigarettes  . Smokeless tobacco: Never Used  . Tobacco comment: reports 'I'm using nicorette to quit'  Substance Use Topics  . Alcohol use: Not Currently    Comment: reports 'excessively', does not specify; no alcohol this preg  . Drug use: Not Currently    Comment: none this preg    Allergies: No Known Allergies  Meds:  Medications  Prior to Admission  Medication Sig Dispense Refill Last Dose  . IRON PO Take by mouth.   Past Month at Unknown time  . omega-3 acid ethyl esters (LOVAZA) 1 g capsule Take 1 g by mouth daily.   Past Month at Unknown time  . Prenatal Vit-Fe Fumarate-FA (PREPLUS) 27-1 MG TABS Take 1 tablet by mouth daily. 30 tablet 13 03/15/2018 at Unknown time  . amphetamine-dextroamphetamine (ADDERALL) 30 MG tablet    Not Taking  . cyclobenzaprine (FLEXERIL) 5 MG tablet Take 1 tablet by mouth as needed.  0 More than a month at Unknown time    I have reviewed patient's Past Medical Hx, Surgical Hx, Family Hx, Social Hx, medications and allergies.   ROS:  Review of Systems  Constitutional: Negative for chills and fever.  Respiratory: Negative for shortness of breath.   Cardiovascular: Negative for leg swelling.  Gastrointestinal: Positive for abdominal pain. Negative for constipation, diarrhea, nausea and vomiting.  Genitourinary: Negative for dysuria and vaginal bleeding.  Neurological: Negative for weakness.   Other systems negative  Physical Exam   Patient Vitals for the past 24 hrs:  BP Temp Temp src Pulse Resp Height Weight  03/16/18 0016 140/89 - - - - - -  03/16/18 0009 (!) 139/92 - - - - - -  03/16/18 0008 (!) 141/84 - - 85 - - -  03/15/18 2353 134/80 98.5 F (36.9 C) Oral 81 20 5\' 8"  (1.727 m) 100.1 kg   Constitutional: Well-developed, well-nourished female in no acute distress.  Cardiovascular: normal rate and rhythm Respiratory: normal effort, clear to auscultation bilaterally GI: Abd soft, non-tender, gravid appropriate for gestational age.   No rebound or guarding. MS: Extremities nontender, Trace edema, normal ROM Neurologic: Alert and oriented x 4.   DTRs brisk, no clonus GU: Neg CVAT.  PELVIC EXAM:  Dilation: 4.5 Effacement (%): 90 Cervical Position: Middle Station: -2 Presentation: Vertex Exam by:: K. WeissRN  FHT:  Baseline 140 , moderate variability, accelerations  present, no decelerations Contractions: q 3-5 mins Irregular     Labs: A/Positive/-- (08/20 1449) Results for orders placed or performed during the hospital encounter of 03/15/18 (from the past 24 hour(s))  Urinalysis, Routine w reflex microscopic     Status: None   Collection Time: 03/16/18 12:25 AM  Result Value Ref Range   Color, Urine YELLOW YELLOW   APPearance CLEAR CLEAR   Specific Gravity, Urine 1.015 1.005 - 1.030   pH 7.0 5.0 - 8.0   Glucose, UA NEGATIVE NEGATIVE mg/dL   Hgb urine dipstick NEGATIVE NEGATIVE   Bilirubin Urine NEGATIVE NEGATIVE   Ketones, ur NEGATIVE NEGATIVE mg/dL   Protein, ur NEGATIVE NEGATIVE mg/dL   Nitrite NEGATIVE NEGATIVE   Leukocytes,Ua NEGATIVE NEGATIVE  Protein / creatinine ratio, urine     Status: None   Collection Time: 03/16/18 12:25 AM  Result Value Ref Range   Creatinine, Urine 65.00 mg/dL   Total Protein, Urine 8 mg/dL   Protein Creatinine Ratio 0.12 0.00 - 0.15 mg/mg[Cre]  CBC     Status: Abnormal   Collection Time: 03/16/18 12:39 AM  Result Value Ref Range   WBC 11.9 (H) 4.0 - 10.5 K/uL   RBC 3.61 (L) 3.87 - 5.11 MIL/uL   Hemoglobin 11.1 (L) 12.0 - 15.0 g/dL   HCT 16.133.2 (L) 09.636.0 - 04.546.0 %   MCV 92.0 80.0 - 100.0 fL   MCH 30.7 26.0 - 34.0 pg   MCHC 33.4 30.0 - 36.0 g/dL   RDW 40.914.8 81.111.5 - 91.415.5 %   Platelets 263 150 - 400 K/uL  Comprehensive metabolic panel     Status: Abnormal   Collection Time: 03/16/18 12:39 AM  Result Value Ref Range   Sodium 136 135 - 145 mmol/L   Potassium 3.3 (L) 3.5 - 5.1 mmol/L   Chloride 106 98 - 111 mmol/L   CO2 21 (L) 22 - 32 mmol/L   Glucose, Bld 78 70 - 99 mg/dL   BUN 6 6 - 20 mg/dL   Creatinine, Ser 7.820.60 0.44 - 1.00 mg/dL   Calcium 8.9 8.9 - 95.610.3 mg/dL   Total Protein 6.9 6.5 - 8.1 g/dL   Albumin 3.0 (L) 3.5 - 5.0 g/dL   AST 19 15 - 41 U/L   ALT 13 0 - 44 U/L   Alkaline Phosphatase 78 38 - 126 U/L   Total Bilirubin 0.6 0.3 - 1.2 mg/dL   GFR calc non Af Amer >60 >60 mL/min   GFR calc Af Amer  >60 >60 mL/min   Anion gap 9 5 - 15    Imaging:    MAU Course/MDM: I have ordered labs and reviewed results. Labs are normal NST reviewed, reactive. Consult Dr Earlene PlaterWallace with presentation, exam findings and test  results. Recommend admission for gestational hypertension.  Since she is almost 40 weeks, would admit and deliver.   Assessment: Single intrauterine pregnancy at [redacted]w[redacted]d Gestational Hypertension, without evidence of preeclampsia Latent phase labor  Plan: Admit to Birthing Suites Routine orders MD to follow   Wynelle Bourgeois CNM, MSN Certified Nurse-Midwife 03/16/2018 12:39 AM

## 2018-03-16 NOTE — Anesthesia Postprocedure Evaluation (Signed)
Anesthesia Post Note  Patient: Amanda Wagner  Procedure(s) Performed: AN AD HOC LABOR EPIDURAL     Patient location during evaluation: Mother Baby Anesthesia Type: Epidural Level of consciousness: awake and alert Pain management: pain level controlled Vital Signs Assessment: post-procedure vital signs reviewed and stable Respiratory status: spontaneous breathing, nonlabored ventilation and respiratory function stable Cardiovascular status: stable Postop Assessment: no headache, no backache, epidural receding, no apparent nausea or vomiting, patient able to bend at knees, able to ambulate and adequate PO intake Anesthetic complications: no    Last Vitals:  Vitals:   03/16/18 0617 03/16/18 0645  BP: 127/75 133/76  Pulse: 74 70  Resp: 15 18  Temp:    SpO2:  99%    Last Pain:  Vitals:   03/16/18 0645  TempSrc:   PainSc: 0-No pain   Pain Goal: Patients Stated Pain Goal: 2 (03/16/18 0247)                 Laban Emperor

## 2018-03-17 ENCOUNTER — Encounter (HOSPITAL_COMMUNITY): Payer: Self-pay | Admitting: *Deleted

## 2018-03-17 MED ORDER — ENALAPRIL MALEATE 5 MG PO TABS: 5.0000 mg | ORAL_TABLET | Freq: Every day | ORAL | 1 refills | Status: DC

## 2018-03-17 MED ORDER — IBUPROFEN 600 MG PO TABS: 600.0000 mg | ORAL_TABLET | Freq: Four times a day (QID) | ORAL | 0 refills | Status: AC | PRN

## 2018-03-17 MED ORDER — ENALAPRIL MALEATE 5 MG PO TABS
5.0000 mg | ORAL_TABLET | Freq: Every day | ORAL | Status: DC
  Filled 2018-03-17 (×2): qty 1

## 2018-03-17 MED ORDER — MEASLES, MUMPS & RUBELLA VAC IJ SOLR
0.5000 mL | Freq: Once | INTRAMUSCULAR | Status: AC
  Administered 2018-03-17: 0.5 mL via SUBCUTANEOUS
  Filled 2018-03-17: qty 0.5

## 2018-03-17 MED ORDER — MEASLES, MUMPS & RUBELLA VAC IJ SOLR: 0.5000 mL | Freq: Once | INTRAMUSCULAR | 0 refills | Status: AC

## 2018-03-17 NOTE — Progress Notes (Signed)
Post Partum Day 1 Subjective: Patient reports that she is doing well following delivery.  She is tolerating po and denies nausea, vomiting.  Since the delivery, she has passed flatus, but has not yet had a bowel movement.  She denies any significant pain, or headache.  She also denies visual changes or dizziness.  She has been ambulating and denies edema or shortness of breath.  She reports some pain with breastfeeding, but has been working with lactation.    Objective: Blood pressure (!) 148/90, pulse 75, temperature 98.9 F (37.2 C), temperature source Oral, resp. rate 18, height 5\' 8"  (1.727 m), weight 100.1 kg, last menstrual period 06/30/2017, SpO2 100 %.  Physical Exam:  General: alert, cooperative and appears stated age Lochia: appropriate Uterine Fundus: firm Cardiac: normal cardiac rate and rhythm DVT Evaluation: No evidence of DVT seen on physical exam. Negative Homan's sign. No cords or calf tenderness. No significant calf/ankle edema.  Recent Labs    03/16/18 0039  HGB 11.1*  HCT 33.2*    Assessment/Plan: A:  Postpartum day 1  P: Plan for discharge Birth control options discussed with patient.  Due to religious preferences, she is interested in condoms at this time Consult with lactation    LOS: 1 day   Francene Boyers 03/17/2018, 7:47 AM

## 2018-03-17 NOTE — Progress Notes (Signed)
Post Partum Day #1 Subjective: no complaints, up ad lib, voiding and tolerating PO  Objective: Blood pressure 124/88, pulse 70, temperature 98.9 F (37.2 C), temperature source Oral, resp. rate 18, height 5\' 8"  (1.727 m), weight 100.1 kg, last menstrual period 06/30/2017, SpO2 100 %.  Physical Exam:  General: no distress Lochia: appropriate Uterine Fundus: firm Incision: NA DVT Evaluation: No evidence of DVT seen on physical exam.  Recent Labs    03/16/18 0039  HGB 11.1*  HCT 33.2*     Patient Vitals for the past 24 hrs:  BP Temp Temp src Pulse Resp SpO2  03/17/18 1310 124/88 - - - - -  03/17/18 1050 120/83 - - 70 - -  03/17/18 0527 (!) 148/90 98.9 F (37.2 C) Oral 75 - 100 %  03/16/18 2128 122/81 98 F (36.7 C) Oral 71 18 98 %  03/16/18 1926 117/78 98.6 F (37 C) Oral 71 20 99 %  03/16/18 1515 119/76 98.3 F (36.8 C) Oral 62 16 100 %    Assessment/Plan: Plan for discharge tomorrow -Will hold enalapril for now as patient has had one elevated pressure since delivery.  -Will discuss with labor team as to whether meds are indicated.   LOS: 1 day   Amanda Wagner 03/17/2018, 1:38 PM

## 2018-03-17 NOTE — Lactation Note (Addendum)
This note was copied from a baby's chart. Lactation Consultation Note:   Infant 48 hours old. Mother attempting to calm infant when I arrived in the room. Observed cues.  Suggested that mother feed infant.   Assist mother with latching infant on in cross cradle.  Infant bouncing on and off the breast.  Assist mother with off sided latch.  Infant placed in football hold.  Infant observed suckling and swallows.  Mother taught to do breast compression.  Mother was given a harmony hand pump with instructions . Mother was given comfort gels. Nipple are only slightly pink.  Discussed cluster feeding.  Advised mother to continue to feed infant on cue.  Discussed limited use of pacifier. ' Discussed supply and demand.  Mother receptive to all teaching.   Patient Name: Amanda Wagner [id number] Date: 03/17/2018 Reason for consult: Follow-up assessment   Maternal Data    Feeding Feeding Type: Breast Fed  LATCH Score Latch: Grasps breast easily, tongue down, lips flanged, rhythmical sucking.  Audible Swallowing: Spontaneous and intermittent  Type of Nipple: Everted at rest and after stimulation  Comfort (Breast/Nipple): Filling, red/small blisters or bruises, mild/mod discomfort(slightly tender)  Hold (Positioning): Assistance needed to correctly position infant at breast and maintain latch.  LATCH Score: 8  Interventions Interventions: Assisted with latch;Skin to skin;Hand express;Pre-pump if needed;Breast compression;Adjust position;Support pillows;Position options;Expressed milk;Comfort gels;Hand pump  Lactation Tools Discussed/Used     Consult Status Consult Status: Follow-up Date: 03/18/18 Follow-up type: In-patient    Stevan Born [facility] 03/17/2018, 2:52 PM

## 2018-03-17 NOTE — Progress Notes (Signed)
CSW received consult for hx Depression and SI. CSW met with MOB to offer support and complete assessment.    MOB was performing skin-to-skin with baby while laying in bed. FOB was noted to be sleeping on the sofa. CSW ensured MOB was ok with CSW asking any questions while FOB was in the room. CSW introduced self and reason for consult. CSW inquired about MOB's mental health history. MOB reported she experienced some depression and SI back in 2017 and was brought to the hospital and admitted to [facility]. MOB attributed her depression to her "drinking" and environmental stressors. MOB stated she no longer drinks and feels better without it. MOB stated she sometimes has some light depression but feels it is manageable. MOB stated she scored a 0 on her [place] assessment. MOB reported her mom, FOB, his mom and her friends as good supports. MOB denied any current SI/HI or DV in the home. MOB denied any current mental health concerns or symptoms.   CSW provided education regarding the baby blues period vs. perinatal mood disorders, discussed treatment and gave resources for mental health follow up if concerns arise.  CSW recommends self-evaluation during the postpartum time period using the New Mom Checklist from Postpartum Progress and encouraged MOB to contact a medical professional if symptoms are noted at any time.    CSW identifies no further need for intervention and no barriers to discharge at this time.  Ollen Barges, [place] Social Work Department  [phone]

## 2018-03-17 NOTE — Progress Notes (Signed)
Patient would like to hold BP meds. [place] to [place], [place] about patients BP meds and she said it was okay to hold for now and recheck this afternoon to see how BP's trend. Will continue to monitor. Royston Cowper, RN

## 2018-03-17 NOTE — Discharge Instructions (Signed)
Postpartum Hypertension °Postpartum hypertension is high blood pressure that remains higher than normal after childbirth. You may not realize that you have postpartum hypertension if your blood pressure is not being checked regularly. In most cases, postpartum hypertension will go away on its own, usually within a week of delivery. However, for some women, medical treatment is required to prevent serious complications, such as seizures or stroke. °What are the causes? °This condition may be caused by one or more of the following: °· Hypertension that existed before pregnancy (chronic hypertension). °· Hypertension that comes on as a result of pregnancy (gestational hypertension). °· Hypertensive disorders during pregnancy (preeclampsia) or seizures in women who have high blood pressure during pregnancy (eclampsia). °· A condition in which the liver, platelets, and red blood cells are damaged during pregnancy (HELLP syndrome). °· A condition in which the thyroid produces too much hormones (hyperthyroidism). °· Other rare problems of the nerves (neurological disorders) or blood disorders. °In some cases, the cause may not be known. °What increases the risk? °The following factors may make you more likely to develop this condition: °· Chronic hypertension. In some cases, this may not have been diagnosed before pregnancy. °· Obesity. °· Type 2 diabetes. °· Kidney disease. °· History of preeclampsia or eclampsia. °· Other medical conditions that change the level of hormones in the body (hormonal imbalance). °What are the signs or symptoms? °As with all types of hypertension, postpartum hypertension may not have any symptoms. Depending on how high your blood pressure is, you may experience: °· Headaches. These may be mild, moderate, or severe. They may also be steady, constant, or sudden in onset (thunderclap headache). °· Changes in your ability to see (visual changes). °· Dizziness. °· Shortness of breath. °· Swelling  of your hands, feet, lower legs, or face. In some cases, you may have swelling in more than one of these locations. °· Heart palpitations or a racing heartbeat. °· Difficulty breathing while lying down. °· Decrease in the amount of urine that you pass. °Other rare signs and symptoms may include: °· Sweating more than usual. This lasts longer than a few days after delivery. °· Chest pain. °· Sudden dizziness when you get up from sitting or lying down. °· Seizures. °· Nausea or vomiting. °· Abdominal pain. °How is this diagnosed? °This condition may be diagnosed based on the results of a physical exam, blood pressure measurements, and blood and urine tests. °You may also have other tests, such as a CT scan or an MRI, to check for other problems of postpartum hypertension. °How is this treated? °If blood pressure is high enough to require treatment, your options may include: °· Medicines to reduce blood pressure (antihypertensives). Tell your health care provider if you are breastfeeding or if you plan to breastfeed. There are many antihypertensive medicines that are safe to take while breastfeeding. °· Stopping medicines that may be causing hypertension. °· Treating medical conditions that are causing hypertension. °· Treating the complications of hypertension, such as seizures, stroke, or kidney problems. °Your health care provider will also continue to monitor your blood pressure closely until it is within a safe range for you. °Follow these instructions at home: °· Take over-the-counter and prescription medicines only as told by your health care provider. °· Return to your normal activities as told by your health care provider. Ask your health care provider what activities are safe for you. °· Do not use any products that contain nicotine or tobacco, such as cigarettes and e-cigarettes. If   you need help quitting, ask your health care provider. °· Keep all follow-up visits as told by your health care provider. This  is important. °Contact a health care provider if: °· Your symptoms get worse. °· You have new symptoms, such as: °? A headache that does not get better. °? Dizziness. °? Visual changes. °Get help right away if: °· You suddenly develop swelling in your hands, ankles, or face. °· You have sudden, rapid weight gain. °· You develop difficulty breathing, chest pain, racing heartbeat, or heart palpitations. °· You develop severe pain in your abdomen. °· You have any symptoms of a stroke. "BE FAST" is an easy way to remember the main warning signs of a stroke: °? B - Balance. Signs are dizziness, sudden trouble walking, or loss of balance. °? E - Eyes. Signs are trouble seeing or a sudden change in vision. °? F - Face. Signs are sudden weakness or numbness of the face, or the face or eyelid drooping on one side. °? A - Arms. Signs are weakness or numbness in an arm. This happens suddenly and usually on one side of the body. °? S - Speech. Signs are sudden trouble speaking, slurred speech, or trouble understanding what people say. °? T - Time. Time to call emergency services. Write down what time symptoms started. °· You have other signs of a stroke, such as: °? A sudden, severe headache with no known cause. °? Nausea or vomiting. °? Seizure. °These symptoms may represent a serious problem that is an emergency. Do not wait to see if the symptoms will go away. Get medical help right away. Call your local emergency services (911 in the [place] Do not drive yourself to the hospital. °Summary °· Postpartum hypertension is high blood pressure that remains higher than normal after childbirth. °· In most cases, postpartum hypertension will go away on its own, usually within a week of delivery. °· For some women, medical treatment is required to prevent serious complications, such as seizures or stroke. °This information is not intended to replace advice given to you by your health care provider. Make sure you discuss any questions  you have with your health care provider. °Document Released: 09/15/2013 Document Revised: 11/02/2016 Document Reviewed: 11/02/2016 °Elsevier Interactive Patient Education © 2019 [employer] °Vaginal Delivery, Care After °Refer to this sheet in the next few weeks. These instructions provide you with information about caring for yourself after vaginal delivery. Your health care provider may also give you more specific instructions. Your treatment has been planned according to current medical practices, but problems sometimes occur. Call your health care provider if you have any problems or questions. °What can I expect after the procedure? °After vaginal delivery, it is common to have: °· Some bleeding from your vagina. °· Soreness in your abdomen, your vagina, and the area of skin between your vaginal opening and your anus (perineum). °· Pelvic cramps. °· Fatigue. °Follow these instructions at home: °Medicines °· Take over-the-counter and prescription medicines only as told by your health care provider. °· If you were prescribed an antibiotic medicine, take it as told by your health care provider. Do not stop taking the antibiotic until it is finished. °Driving ° °· Do not drive or operate heavy machinery while taking prescription pain medicine. °· Do not drive for 24 hours if you received a sedative. °Lifestyle °· Do not drink alcohol. This is especially important if you are breastfeeding or taking medicine to relieve pain. °· Do not use tobacco   products, including cigarettes, chewing tobacco, or e-cigarettes. If you need help quitting, ask your health care provider. °Eating and drinking °· Drink at least 8 eight-ounce glasses of water every day unless you are told not to by your health care provider. If you choose to breastfeed your baby, you may need to drink more water than this. °· Eat high-fiber foods every day. These foods may help prevent or relieve constipation. High-fiber foods include: °? Whole grain  cereals and breads. °? Brown rice. °? Beans. °? Fresh fruits and vegetables. °Activity °· Return to your normal activities as told by your health care provider. Ask your health care provider what activities are safe for you. °· Rest as much as possible. Try to rest or take a nap when your baby is sleeping. °· Do not lift anything that is heavier than your baby or 10 lb (4.5 kg) until your health care provider says that it is safe. °· Talk with your health care provider about when you can engage in sexual activity. This may depend on your: °? Risk of infection. °? Rate of healing. °? Comfort and desire to engage in sexual activity. °Vaginal Care °· If you have an episiotomy or a vaginal tear, check the area every day for signs of infection. Check for: °? More redness, swelling, or pain. °? More fluid or blood. °? Warmth. °? Pus or a bad smell. °· Do not use tampons or douches until your health care provider says this is safe. °· Watch for any blood clots that may pass from your vagina. These may look like clumps of dark red, brown, or black discharge. °General instructions °· Keep your perineum clean and dry as told by your health care provider. °· Wear loose, comfortable clothing. °· Wipe from front to back when you use the toilet. °· Ask your health care provider if you can shower or take a bath. If you had an episiotomy or a perineal tear during labor and delivery, your health care provider may tell you not to take baths for a certain length of time. °· Wear a bra that supports your breasts and fits you well. °· If possible, have someone help you with household activities and help care for your baby for at least a few days after you leave the hospital. °· Keep all follow-up visits for you and your baby as told by your health care provider. This is important. °Contact a health care provider if: °· You have: °? Vaginal discharge that has a bad smell. °? Difficulty urinating. °? Pain when urinating. °? A sudden  increase or decrease in the frequency of your bowel movements. °? More redness, swelling, or pain around your episiotomy or vaginal tear. °? More fluid or blood coming from your episiotomy or vaginal tear. °? Pus or a bad smell coming from your episiotomy or vaginal tear. °? A fever. °? A rash. °? Little or no interest in activities you used to enjoy. °? Questions about caring for yourself or your baby. °· Your episiotomy or vaginal tear feels warm to the touch. °· Your episiotomy or vaginal tear is separating or does not appear to be healing. °· Your breasts are painful, hard, or turn red. °· You feel unusually sad or worried. °· You feel nauseous or you vomit. °· You pass large blood clots from your vagina. If you pass a blood clot from your vagina, save it to show to your health care provider. Do not flush blood clots down the toilet   without having your health care provider look at them. °· You urinate more than usual. °· You are dizzy or light-headed. °· You have not breastfed at all and you have not had a menstrual period for 12 weeks after delivery. °· You have stopped breastfeeding and you have not had a menstrual period for 12 weeks after you stopped breastfeeding. °Get help right away if: °· You have: °? Pain that does not go away or does not get better with medicine. °? Chest pain. °? Difficulty breathing. °? Blurred vision or spots in your vision. °? Thoughts about hurting yourself or your baby. °· You develop pain in your abdomen or in one of your legs. °· You develop a severe headache. °· You faint. °· You bleed from your vagina so much that you fill two sanitary pads in one hour. °This information is not intended to replace advice given to you by your health care provider. Make sure you discuss any questions you have with your health care provider. °Document Released: 01/10/2000 Document Revised: 06/26/2015 Document Reviewed: 01/27/2015 °Elsevier Interactive Patient Education © 2019 [employer] ° °

## 2018-03-18 ENCOUNTER — Encounter (HOSPITAL_COMMUNITY): Payer: Self-pay

## 2018-03-18 ENCOUNTER — Ambulatory Visit: Payer: Self-pay

## 2018-03-18 ENCOUNTER — Ambulatory Visit (HOSPITAL_COMMUNITY): Admission: RE | Admit: 2018-03-18 | Payer: Medicaid Other | Source: Ambulatory Visit

## 2018-03-18 NOTE — Lactation Note (Signed)
This note was copied from a baby's chart. Lactation Consultation Note: P2 mom of baby born at 72 w 6 day. Breast fed her first baby for 2 [employer]- reports she was going to school and it was hard to have time to breast feed. Mom reports this baby has been breast feeding well but her nipples are tender and her breasts are feeling hard. Reviewed engorgement prevention and treatment. Has manual pump in room. Assisted mom with use of manual pump- she pumped for 5 min then wanted to stop. Reports it is hurting. Flange size appropriate. Encouraged to nurse baby with feeding cues. Reports she fed about 1 hour ago. Baby asleep in bassinet. No questions at present. Reviewed our phone number, OP appointments and [facility] as resources for support after DC. To call prn  Patient Name: Amanda Wagner [id number] Date: 03/18/2018 Reason for consult: Follow-up assessment   Maternal Data Formula Feeding for Exclusion: Yes Reason for exclusion: Mother's choice to formula and breast feed on admission Has patient been taught Hand Expression?: Yes Does the patient have breastfeeding experience prior to this delivery?: Yes  Feeding Feeding Type: Breast Fed  LATCH Score                   Interventions    Lactation Tools Discussed/Used     Consult Status Consult Status: Complete    Pamelia Hoit 03/18/2018, 11:08 AM

## 2018-03-23 ENCOUNTER — Ambulatory Visit (INDEPENDENT_AMBULATORY_CARE_PROVIDER_SITE_OTHER): Payer: Medicaid Other

## 2018-03-23 VITALS — BP 137/89 | HR 70 | Ht 67.0 in | Wt 206.3 lb

## 2018-03-23 DIAGNOSIS — O133 Gestational [pregnancy-induced] hypertension without significant proteinuria, third trimester: Secondary | ICD-10-CM

## 2018-03-23 DIAGNOSIS — Z0130 Encounter for examination of blood pressure without abnormal findings: Secondary | ICD-10-CM

## 2018-03-23 NOTE — Progress Notes (Signed)
Subjective:  Amanda Wagner is a 32 y.o. female here for BP check.   Hypertension ROS: Swelling in feet. No headaches, blurry vision or distress.      Objective:  BP 137/89   Pulse 70   Ht 5\' 7"  (1.702 m)   Wt 206 lb 4.8 oz (93.6 kg)   Breastfeeding Yes   BMI 32.31 kg/m   Appearance alert, well appearing, and in no distress. General exam BP noted to be well controlled today in office.    Assessment:   Blood Pressure stable.   Plan:  Follow up: 1 week and as needed.Marland Kitchen

## 2018-03-30 ENCOUNTER — Telehealth: Payer: Self-pay | Admitting: Certified Nurse Midwife

## 2018-03-30 NOTE — Telephone Encounter (Signed)
LVM to CB and reschedule missed BP check appointment

## 2018-04-14 ENCOUNTER — Ambulatory Visit (INDEPENDENT_AMBULATORY_CARE_PROVIDER_SITE_OTHER): Payer: Medicaid Other | Admitting: Certified Nurse Midwife

## 2018-04-14 ENCOUNTER — Other Ambulatory Visit: Payer: Self-pay

## 2018-04-14 ENCOUNTER — Encounter: Payer: Self-pay | Admitting: Certified Nurse Midwife

## 2018-04-14 DIAGNOSIS — Z1389 Encounter for screening for other disorder: Secondary | ICD-10-CM

## 2018-04-14 NOTE — Patient Instructions (Signed)
Colposcopy  Colposcopy is a procedure to examine the lowest part of the uterus (cervix), the vagina, and the area around the vaginal opening (vulva) for abnormalities or signs of disease. The procedure is done using a lighted microscope or magnifying lens (colposcope). If any unusual cells are found during the procedure, your health care provider may remove a tissue sample for testing (biopsy). A colposcopy may be done if you:   Have an abnormal Pap test. A Pap test is a screening test that is used to check for signs of cancer or infection of the vagina, cervix, and uterus.   Have a Pap smear test in which you test positive for high-risk HPV (human papillomavirus).   Have a sore or lesion on your cervix.   Have genital warts on your vulva, vagina, or cervix.   Took certain medicines while pregnant, such as diethylstilbestrol (DES).   Have pain during sexual intercourse.   Have vaginal bleeding, especially after sexual intercourse.   Need to have a cervical polyp removed.   Need to have a lost intrauterine device (IUD) string located.  Let your health care provider know about:   Any allergies you have, including allergies to prescribed medicine, latex, or iodine.   All medicines you are taking, including vitamins, herbs, eye drops, creams, and over-the-counter medicines. Bring a list of all of your medicines to your appointment.   Any problems you or family members have had with anesthetic medicines.   Any blood disorders you have.   Any surgeries you have had.   Any medical conditions you have, such as pelvic inflammatory disease (PID) or endometrial disorder.   Any history of frequent fainting.   Your menstrual cycle and what form of birth control (contraception) you use.   Your medical history, including any prior cervical treatment.   Whether you are pregnant or may be pregnant.  What are the risks?  Generally, this is a safe procedure. However, problems may occur,  including:   Pain.   Infection, which may include a fever, bad-smelling discharge, or pelvic pain.   Bleeding or discharge.   Misdiagnosis.   Fainting and vasovagal reactions, but this is rare.   Allergic reactions to medicines.   Damage to other structures or organs.  What happens before the procedure?   If you have your menstrual period or will have it at the time of your procedure, tell your health care provider. A colposcopy typically is not done during menstruation.   Continue your contraceptive practices before and after the procedure.   For 24 hours before the colposcopy:  ? Do not douche.  ? Do not use tampons.  ? Do not use medicines, creams, or suppositories in the vagina.  ? Do not have sexual intercourse.   Ask your health care provider about:  ? Changing or stopping your regular medicines. This is especially important if you are taking diabetes medicines or blood thinners.  ? Taking medicines such as aspirin and ibuprofen. These medicines can thin your blood. Do not take these medicines before your procedure if your health care provider instructs you not to. It is likely that your health care provider will tell you to avoid taking aspirin or medicine that contains aspirin for 7 days before the procedure.   Follow instructions from your health care provider about eating or drinking restrictions. You will likely need to eat a regular diet the day of the procedure and not skip any meals.   You may have an exam   or testing. A pregnancy test will be taken on the day of the procedure.   You may have a blood or urine sample taken.   Plan to have someone take you home from the hospital or clinic.   If you will be going home right after the procedure, plan to have someone with you for 24 hours.  What happens during the procedure?   You will lie down on your back, with your feet in foot rests (stirrups).   A warmed and lubricated instrument (speculum) will be inserted into your vagina. The  speculum will be used to hold apart the walls of your vagina so your health care provider can see your cervix and the inside of your vagina.   A cotton swab will be used to place a small amount of liquid solution on the areas to be examined. This solution makes it easier to see abnormal cells. You may feel a slight burning during this part.   The colposcope will be used to scan the cervix with a bright white light. The colposcope will be held near your vulvaand will magnify your vulva, vagina, and cervix for easier examination.   Your health care provider may decide to take a biopsy. If so:  ? You may be given medicine to numb the area (local anesthetic).  ? Surgical instruments will be used to suck out mucus and cells through your vagina.  ? You may feel mild pain while the tissue sample is removed.  ? Bleeding may occur. A solution may be used to stop the bleeding.  ? If a sample of tissue is needed from the inside of the cervix, a different procedure called endocervical curettage (ECC) may be completed. During this procedure, a curved instrument (curette) will be used to scrape cells from your cervix or the top of your cervix (endocervix).   Your health care provider will record the location of any abnormalities.  The procedure may vary among health care providers and hospitals.  What happens after the procedure?   You will lie down and rest for a few minutes. You may be offered juice or cookies.   Your blood pressure, heart rate, breathing rate, and blood oxygen level will be monitored until any medicines you were given have worn off.   You may have to wear compression stockings. These stockings help to prevent blood clots and reduce swelling in your legs.   You may have some cramping in your abdomen. This should go away after a few minutes.  This information is not intended to replace advice given to you by your health care provider. Make sure you discuss any questions you have with your health care  provider.  Document Released: 04/04/2002 Document Revised: 09/10/2015 Document Reviewed: 08/19/2015  Elsevier Interactive Patient Education  2019 [employer]

## 2018-04-14 NOTE — Progress Notes (Signed)
Post Partum Exam  Amanda Wagner is a 33 y.o. G15P1001 female who presents for a postpartum visit. She is 4 weeks postpartum following a spontaneous vaginal delivery. I have fully reviewed the prenatal and intrapartum course. The delivery was at 39 gestational weeks.  Anesthesia: epidural. Postpartum course has been unremarkable. Baby's course has been unremarkable. Baby is feeding by both breast and bottle - Carnation Good Start Soy and Lucien Mons Start. Bleeding staining only. Bowel function is normal. Bladder function is normal. Patient is sexually active. Contraception method is condoms. Postpartum depression screening:neg  The following portions of the patient's history were reviewed and updated as appropriate: allergies, current medications, past family history, past medical history and problem list. Last pap smear done 09/14/2017 and was Abnormal- negative cytology and positive HPV  Review of Systems A comprehensive review of systems was negative.    Objective:  Blood pressure 126/87, pulse 85, resp. rate 16, height 5\' 8"  (1.727 m), weight 201 lb (91.2 kg), currently breastfeeding.  General:  alert, cooperative and no distress   Breasts:  inspection negative, no nipple discharge or bleeding, no masses or nodularity palpable  Lungs: clear to auscultation bilaterally  Heart:  regular rate and rhythm and S1, S2 normal  Abdomen: soft, non-tender; bowel sounds normal; no masses,  no organomegaly   Vulva:  not evaluated  Vagina: not evaluated  Cervix:  not evaluated  Corpus: not examined  Adnexa:  not evaluated  Rectal Exam: Not performed.        Assessment:    Normal postpartum exam. Pap smear not done at today's visit. Needs repeat pap in August 2020, if positive HPV on pap then will need colposcopy.   Plan:   1. Contraception: condoms 2. Follow up in: 5 months for annual and repeat pap or as needed.   Sharyon Cable, CNM 04/14/18, 2:15 PM

## 2019-10-16 IMAGING — US US MFM OB COMP +14 WKS
1 series · 13 of 28 positions shown · non-contrast
Comparison: none

[Series 1: us mfm ob comp +14 wks · 13 of 120 slices shown]
[im 5/120]
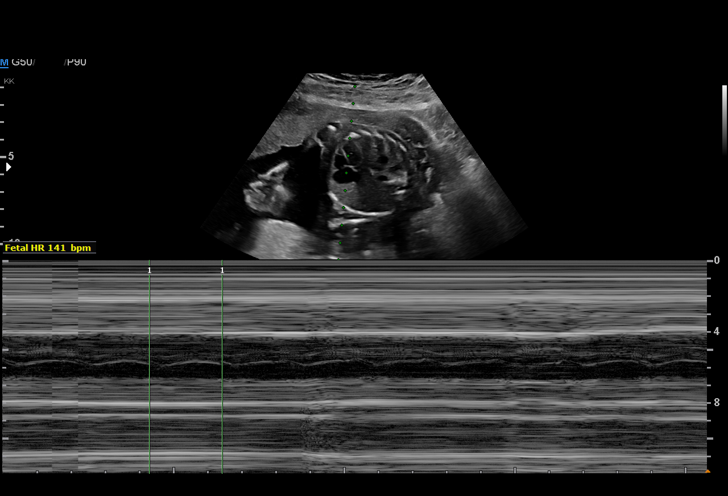
[im 14/120]
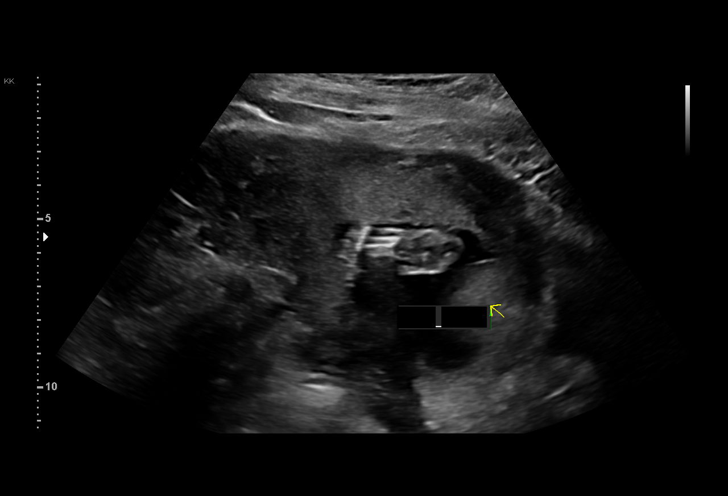
[im 23/120]
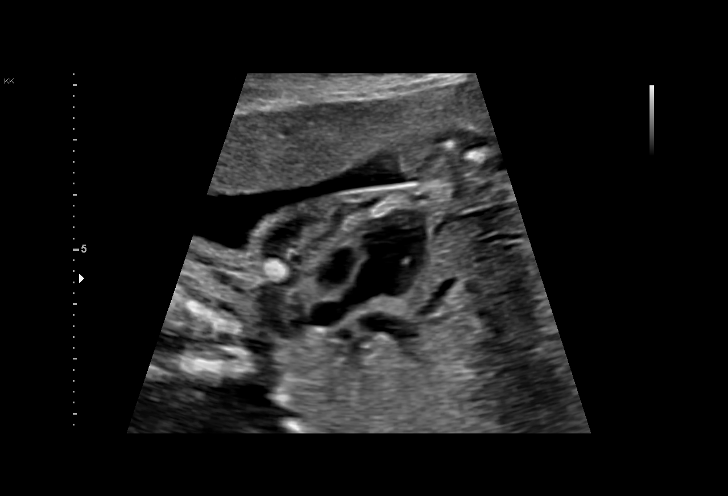
[im 31/120]
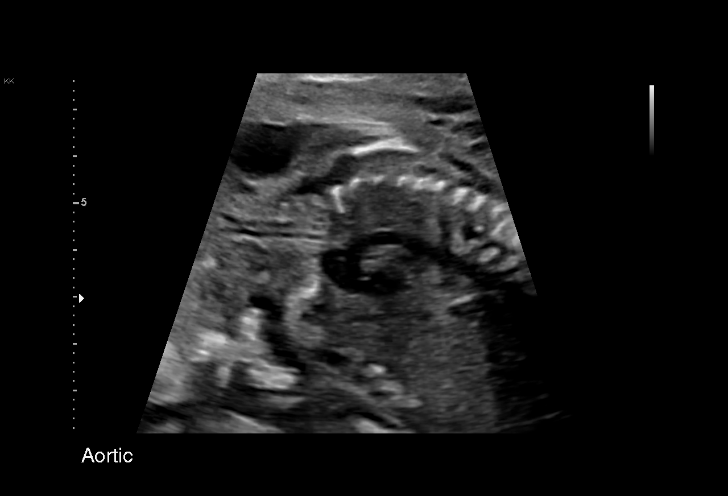
[im 40/120]
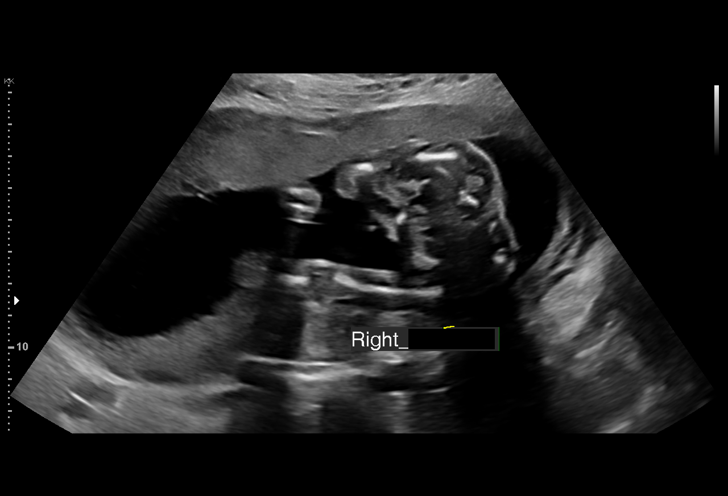
[im 49/120]
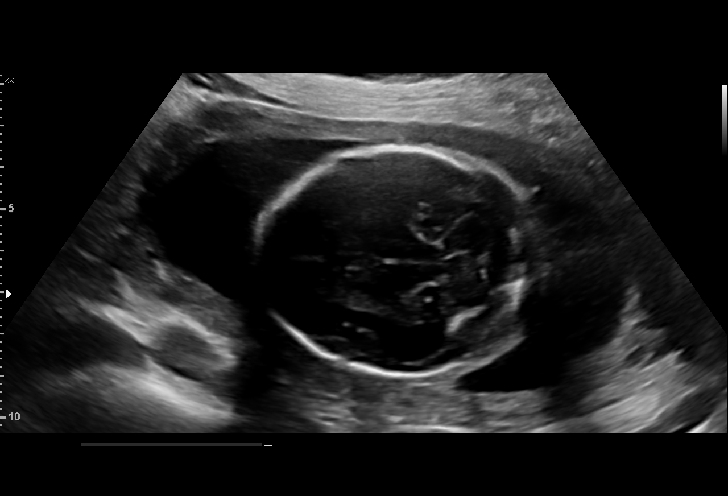
[im 62/120]
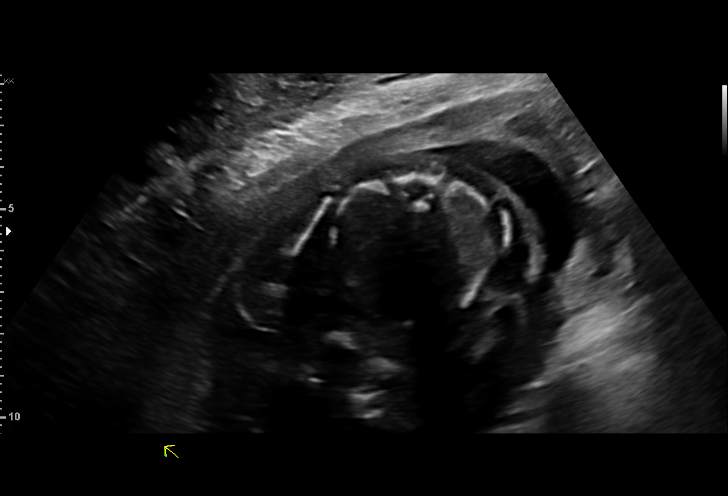
[im 71/120]
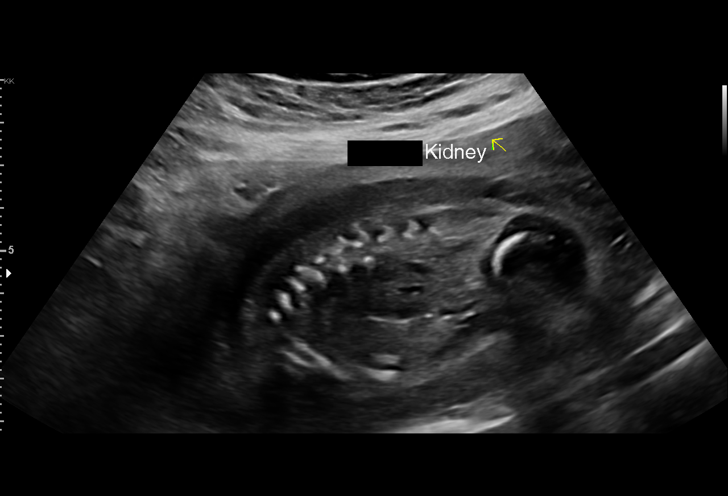
[im 80/120]
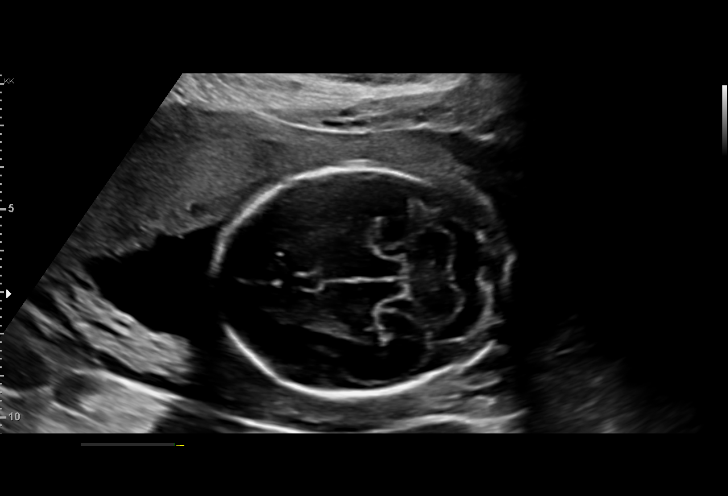
[im 89/120]
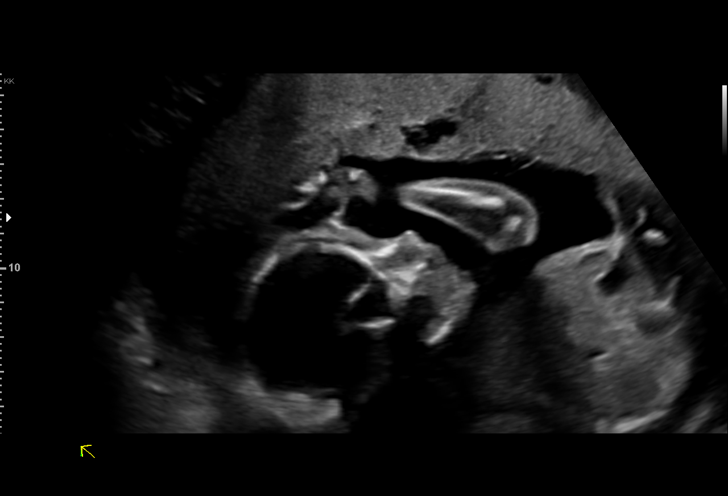
[im 97/120]
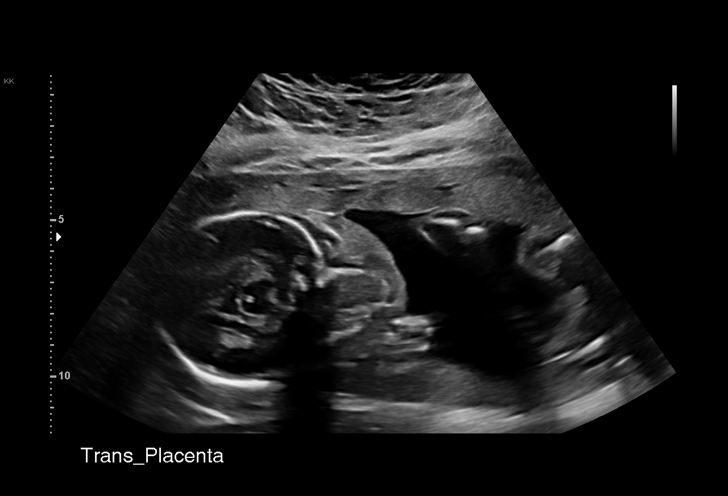
[im 106/120]
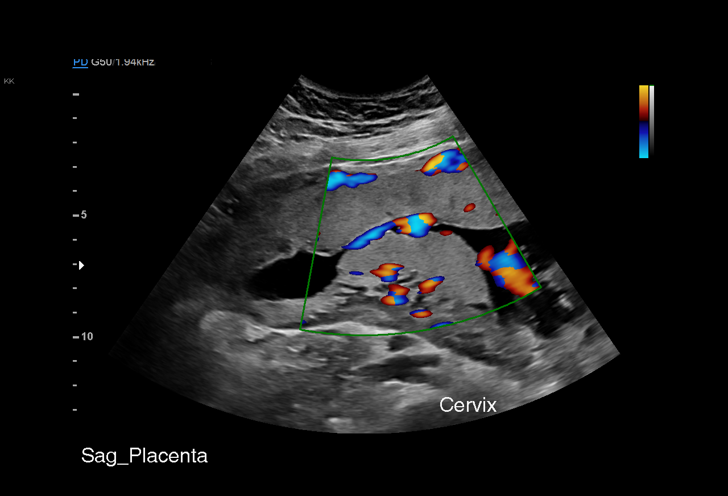
[im 115/120]
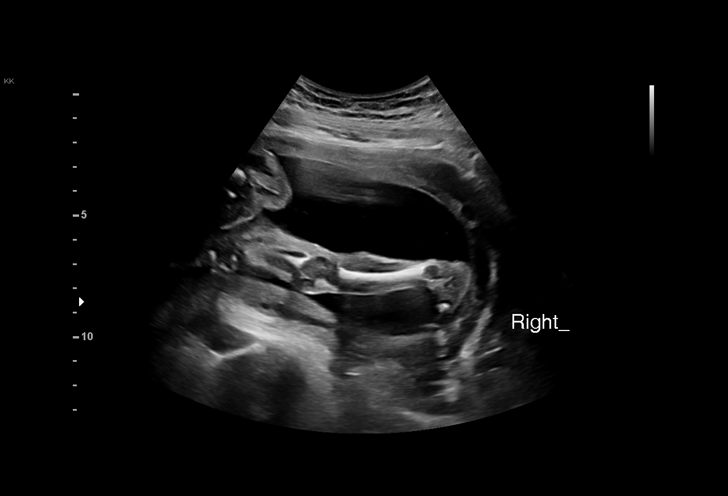

[13 of 28 positions shown; findings below may reference images not displayed]

1  US MFM OB COMP + 14 WK               76805.01     DANII WINFIELD

Indications

Tobacco use complicating pregnancy,
second trimester
Encounter for antenatal screening for
malformations
Alcohol use complicating pregnancy, second
trimester
22 weeks gestation of pregnancy
Placental abnormality complicating
pregnancy, second trimester (succenturiate
lobe)
Fetal Evaluation

Num Of Fetuses:         1
Fetal Heart Rate(bpm):  141
Cardiac Activity:       Observed
Presentation:           Breech
Placenta:               Ant. Placenta, Succenturiate lobe Post.
P. Cord Insertion:      Visualized

Amniotic Fluid
AFI FV:      Within normal limits

Largest Pocket(cm)
5.4
Biometry

BPD:        54  mm     G. Age:  22w 3d         35  %    CI:        72.64   %    70 - 86
FL/HC:      19.8   %    19.2 -
HC:      201.5  mm     G. Age:  22w 2d         21  %    HC/AC:      1.11        1.05 -
AC:      182.3  mm     G. Age:  23w 0d         53  %    FL/BPD:     73.9   %    71 - 87
FL:       39.9  mm     G. Age:  22w 6d         45  %    FL/AC:      21.9   %    20 - 24
HUM:      38.2  mm     G. Age:  23w 4d         62  %

Est. FW:     543  gm      1 lb 3 oz     54  %
OB History

Gravidity:    2         Term:   1        Prem:   0        SAB:   0
TOP:          0       Ectopic:  0        Living: 1
Gestational Age

LMP:           19w 6d        Date:  06/30/17                 EDD:   04/06/18
U/S Today:     22w 5d                                        EDD:   03/17/18
Best:          22w 5d     Det. By:  U/S (11/16/17)           EDD:   03/17/18
Anatomy

Cranium:               Appears normal         Aortic Arch:            Appears normal
Cavum:                 Appears normal         Ductal Arch:            Not well visualized
Ventricles:            Appears normal         Diaphragm:              Appears normal
Choroid Plexus:        Appears normal         Stomach:                Appears normal, left
sided
Cerebellum:            Appears normal         Abdomen:                Appears normal
Posterior Fossa:       Appears normal         Abdominal Wall:         Appears nml (cord
insert, abd wall)
Nuchal Fold:           Appears normal         Cord Vessels:           Appears normal (3
vessel cord)
Face:                  Appears normal         Kidneys:                Appear normal
(orbits and profile)
Lips:                  Appears normal         Bladder:                Appears normal
Thoracic:              Appears normal         Spine:                  Appears normal
Heart:                 Appears normal         Upper Extremities:      Appears normal
(4CH, axis, and situs
RVOT:                  Appears normal         Lower Extremities:      Appears normal
LVOT:                  Appears normal

Other:  Female gender Heels appear normal. Technically difficult due to fetal
position.
Cervix Uterus Adnexa

Cervix
Length:            3.6  cm.
Normal appearance by transabdominal scan.
Impression

We performed fetal anatomy scan. No makers of
aneuploidies or fetal structural defects are seen. Fetal
biometry is consistent with her previously-established dates.
Amniotic fluid is normal and good fetal activity is seen.
Possibility of a posterior succenturiate lobe cannot be ruled
out (fundal connection). I explained the findings and
reassured her that it should not affect adversely the
pregnancy outcome.
Patient understands the limitations of ultrasound in detecting
fetal anomalies.

On cell-free fetal DNA screening, the risks of fetal
aneuploidies are not increased.
Recommendations

An appointment was made for her to return in 8 weeks for
fetal growth and placental assessments.

## 2020-01-11 IMAGING — US US MFM OB FOLLOW-UP
1 series · 14 of 28 positions shown · non-contrast
Comparison: none

[Series 1: us mfm ob follow-up · 14 of 34 slices shown]
[im 2/34]
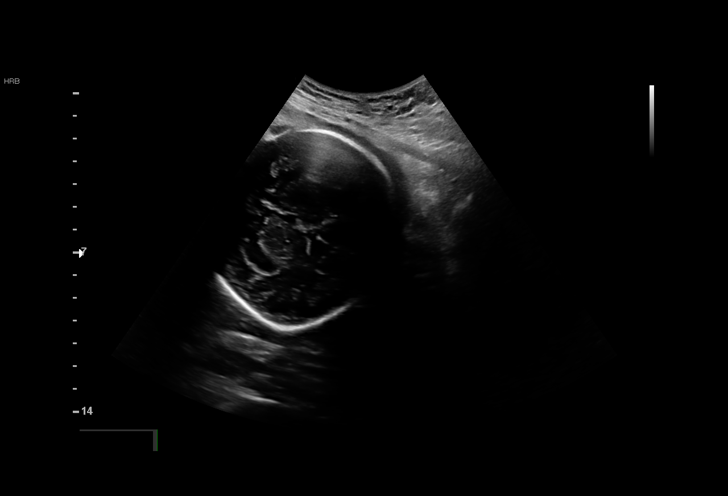
[im 4/34]
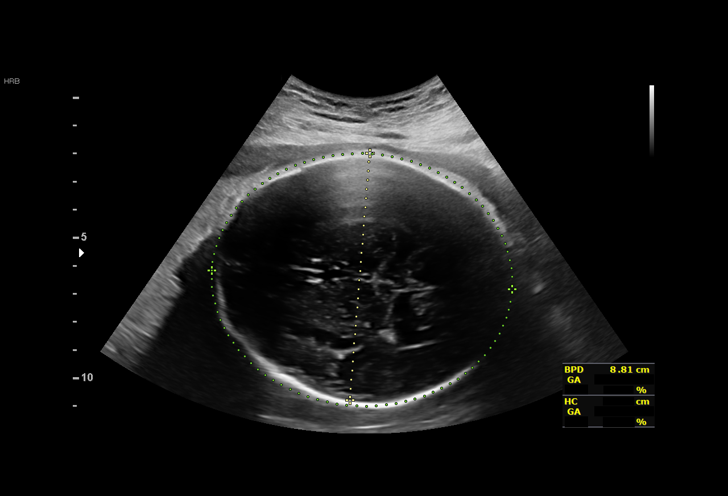
[im 7/34]
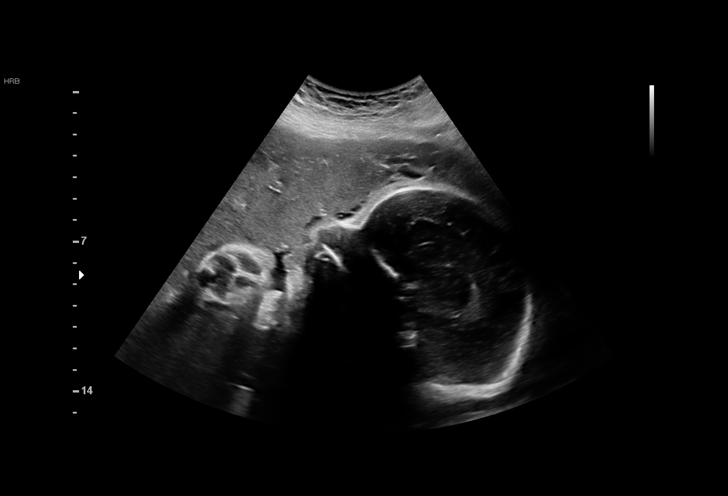
[im 9/34]
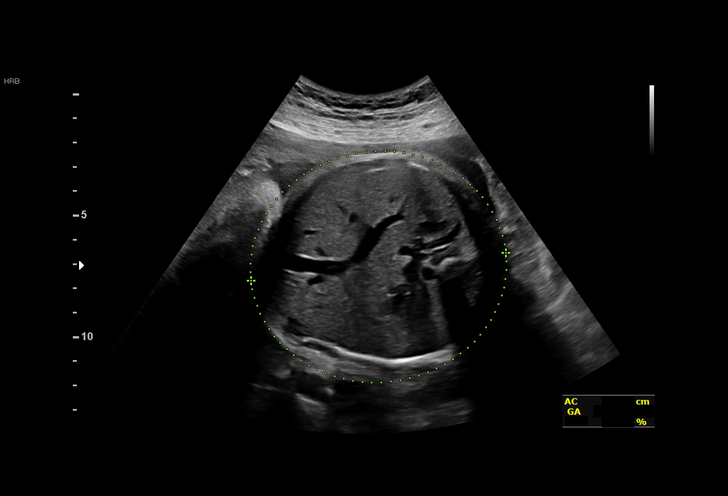
[im 12/34]
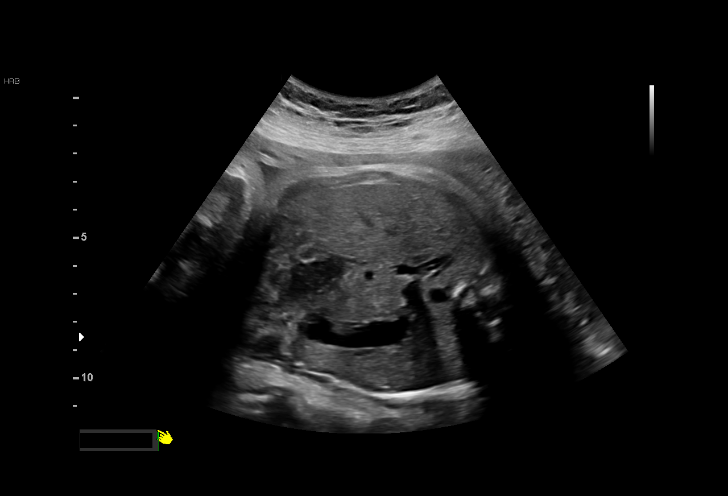
[im 14/34]
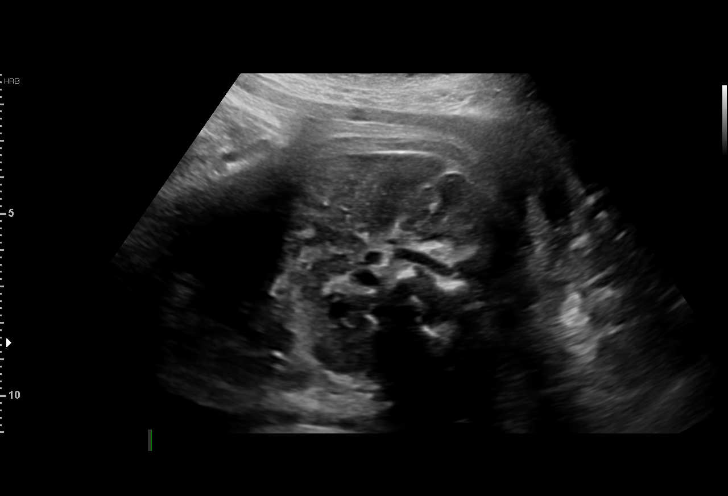
[im 16/34]
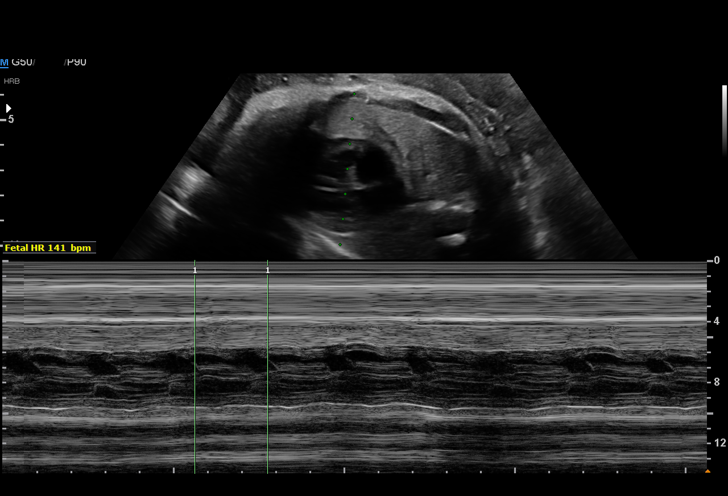
[im 19/34]
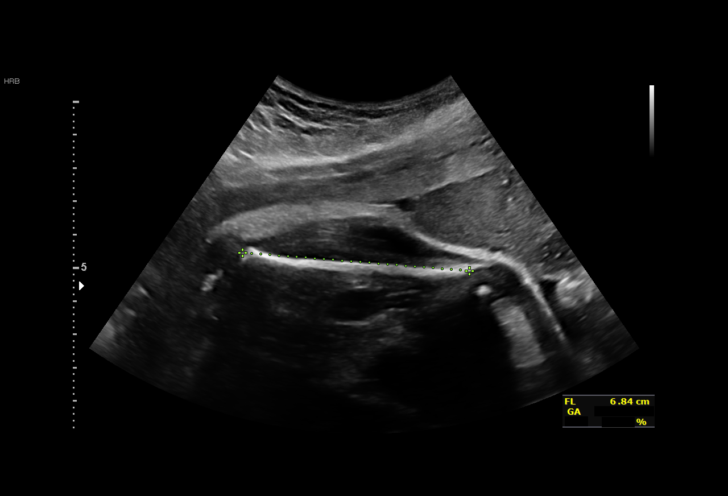
[im 21/34]
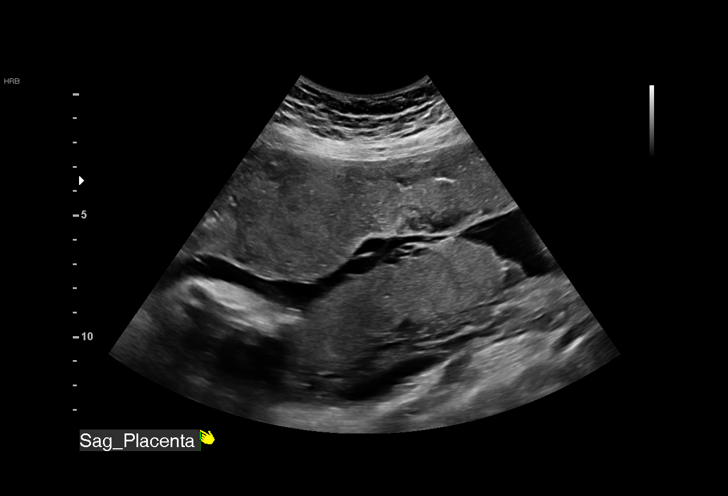
[im 24/34]
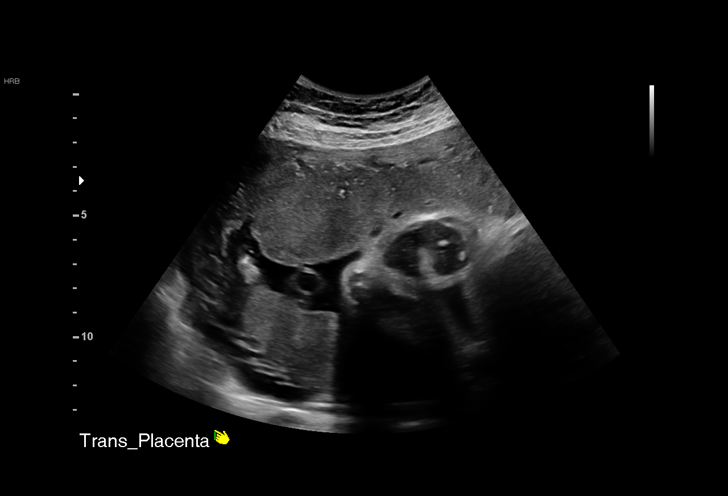
[im 26/34]
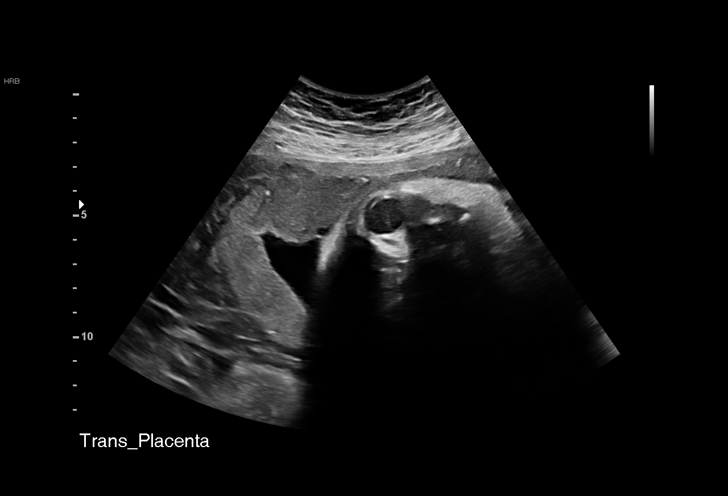
[im 29/34]
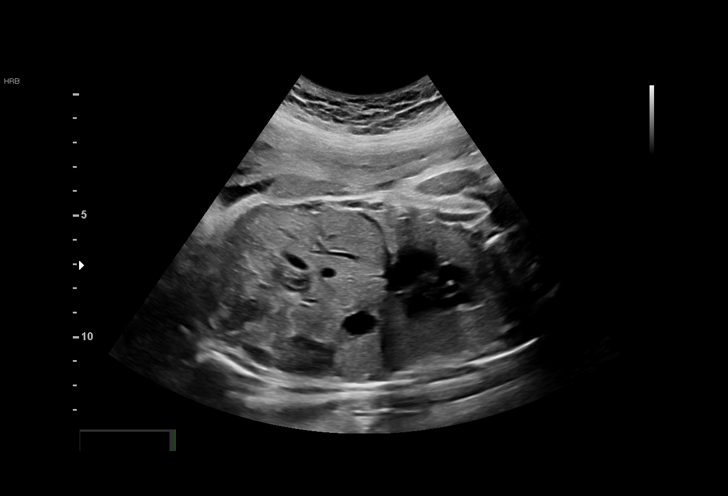
[im 31/34]
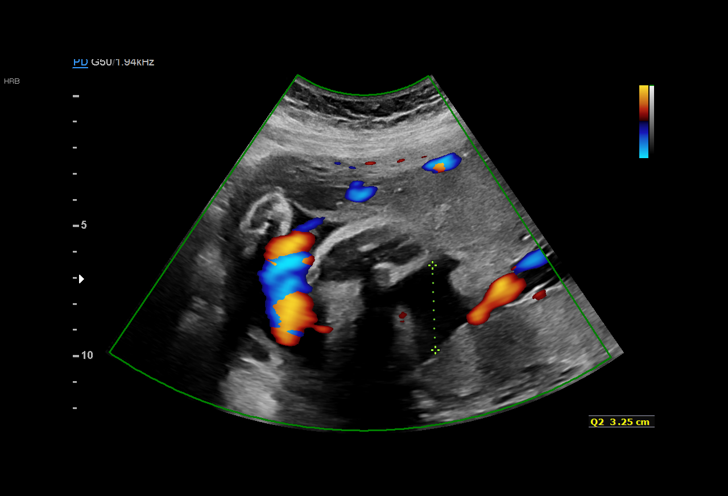
[im 34/34]
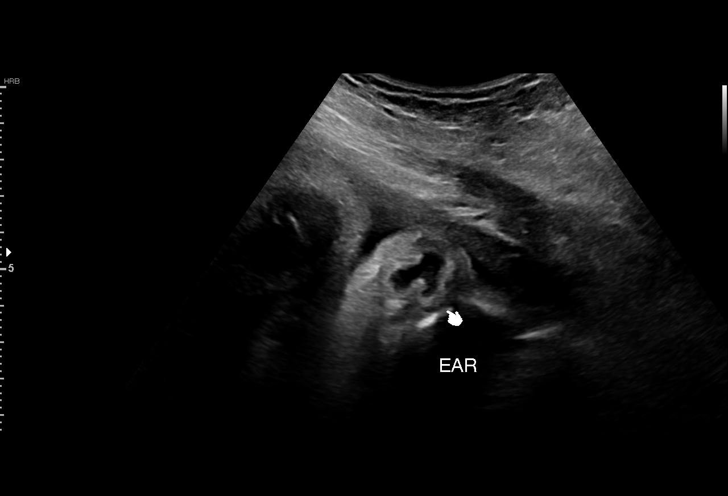

[14 of 28 positions shown; findings below may reference images not displayed]

----------------------------------------------------------------------

 ----------------------------------------------------------------------
Indications

  Placental abnormality complicating
  pregnancy, second trimester (succenturiate
  lobe)
  Encounter for other antenatal screening
  follow-up
  Tobacco use complicating pregnancy,
  second trimester
  Alcohol use complicating pregnancy, second
  trimester
  History of congenital or genetic condition
  (Lusine Jim disease, cardiac
  radiofrequency ablation)
  35 weeks gestation of pregnancy
 ----------------------------------------------------------------------
Vital Signs

 BMI:
Fetal Evaluation

 Num Of Fetuses:          1
 Fetal Heart Rate(bpm):   141
 Cardiac Activity:        Observed
 Presentation:            Cephalic
 Placenta:                Anterior
 P. Cord Insertion:       Previously Visualized

 Amniotic Fluid
 AFI FV:      Within normal limits

 AFI Sum(cm)     %Tile       Largest Pocket(cm)
 12.08           36
 RUQ(cm)       RLQ(cm)       LUQ(cm)        LLQ(cm)

Biometry

 BPD:      88.4  mm     G. Age:  35w 5d         70  %    CI:        81.59   %    70 - 86
                                                         FL/HC:       22.5  %    20.1 -
 HC:      308.9  mm     G. Age:  34w 3d          9  %    HC/AC:       0.98       0.93 -
 AC:      314.6  mm     G. Age:  35w 3d         64  %    FL/BPD:      78.7  %    71 - 87
 FL:       69.6  mm     G. Age:  35w 5d         58  %    FL/AC:       22.1  %    20 - 24
 LV:        4.9  mm

 Est. FW:    7731   gm   5 lb 14 oz      67  %
OB History

 Gravidity:    2         Term:   1        Prem:   0        SAB:   0
 TOP:          0       Ectopic:  0        Living: 1
Gestational Age

 LMP:           32w 2d        Date:  06/30/17                 EDD:   04/06/18
 U/S Today:     35w 2d                                        EDD:   03/16/18
 Best:          35w 1d     Det. By:  U/S  (11/16/17)          EDD:   03/17/18
Anatomy

 Cranium:               Appears normal         Aortic Arch:            Previously seen
 Cavum:                 Appears normal         Ductal Arch:            Not well visualized
 Ventricles:            Appears normal         Diaphragm:              Appears normal
 Choroid Plexus:        Previously seen        Stomach:                Appears normal, left
                                                                       sided
 Cerebellum:            Previously seen        Abdomen:                Appears normal
 Posterior Fossa:       Previously seen        Abdominal Wall:         Previously seen
 Nuchal Fold:           Previously seen        Cord Vessels:           Previously seen
 Face:                  Orbits and profile     Kidneys:                Appear normal
                        previously seen
 Lips:                  Previously seen        Bladder:                Appears normal
 Thoracic:              Appears normal         Spine:                  Previously seen
 Heart:                 Previously seen        Upper Extremities:      Previously seen
 RVOT:                  Previously seen        Lower Extremities:      Previously seen
 LVOT:                  Previously seen

 Other:  Female gender prev seen. Heels prev seen. Technically difficult due
         to fetal position.
Cervix Uterus Adnexa

 Cervix
 Not visualized (advanced GA >64wks)
Impression

 Normal interval growth.
 Succentruiate lobe again noted.
Recommendations

 Follow up in 4 weeks.
